# Patient Record
Sex: Female | Born: 1965 | Race: White | Hispanic: No | Marital: Married | Smoking: Never smoker
Health system: Southern US, Community
[De-identification: ages and names within clinical notes are randomized; demographics above are authoritative.]

## PROBLEM LIST (undated history)

## (undated) DIAGNOSIS — E119 Type 2 diabetes mellitus without complications: Secondary | ICD-10-CM

## (undated) DIAGNOSIS — I10 Essential (primary) hypertension: Secondary | ICD-10-CM

## (undated) DIAGNOSIS — R7303 Prediabetes: Secondary | ICD-10-CM

## (undated) DIAGNOSIS — K219 Gastro-esophageal reflux disease without esophagitis: Secondary | ICD-10-CM

## (undated) DIAGNOSIS — F419 Anxiety disorder, unspecified: Secondary | ICD-10-CM

## (undated) DIAGNOSIS — R011 Cardiac murmur, unspecified: Secondary | ICD-10-CM

## (undated) DIAGNOSIS — E785 Hyperlipidemia, unspecified: Secondary | ICD-10-CM

## (undated) HISTORY — PX: DILATION AND CURETTAGE OF UTERUS: SHX78

## (undated) HISTORY — DX: Prediabetes: R73.03

## (undated) HISTORY — PX: TENOTOMY: SHX397

## (undated) HISTORY — DX: Hyperlipidemia, unspecified: E78.5

## (undated) HISTORY — PX: COLONOSCOPY: SHX174

---

## 2004-10-02 ENCOUNTER — Emergency Department: Payer: Self-pay | Admitting: Emergency Medicine

## 2005-07-19 ENCOUNTER — Emergency Department: Payer: Self-pay | Admitting: Emergency Medicine

## 2008-05-31 ENCOUNTER — Emergency Department: Payer: Self-pay | Admitting: Emergency Medicine

## 2008-05-31 ENCOUNTER — Other Ambulatory Visit: Payer: Self-pay

## 2008-06-01 DIAGNOSIS — R011 Cardiac murmur, unspecified: Secondary | ICD-10-CM | POA: Insufficient documentation

## 2009-05-13 ENCOUNTER — Ambulatory Visit: Payer: Self-pay | Admitting: Gastroenterology

## 2013-10-17 ENCOUNTER — Ambulatory Visit: Payer: Self-pay

## 2013-10-17 DIAGNOSIS — M25519 Pain in unspecified shoulder: Secondary | ICD-10-CM

## 2013-10-17 DIAGNOSIS — R030 Elevated blood-pressure reading, without diagnosis of hypertension: Secondary | ICD-10-CM

## 2015-10-13 DIAGNOSIS — J309 Allergic rhinitis, unspecified: Secondary | ICD-10-CM | POA: Insufficient documentation

## 2015-12-26 ENCOUNTER — Encounter: Payer: Self-pay | Admitting: *Deleted

## 2015-12-27 NOTE — Discharge Instructions (Signed)

## 2015-12-28 ENCOUNTER — Encounter
Admission: RE | Disposition: A | Discharge status: Home / Self Care | Payer: Self-pay | Source: Ambulatory Visit | Attending: Gastroenterology

## 2015-12-28 ENCOUNTER — Encounter: Payer: Self-pay | Admitting: *Deleted

## 2015-12-28 ENCOUNTER — Ambulatory Visit: Payer: 59 | Admitting: Anesthesiology

## 2015-12-28 ENCOUNTER — Ambulatory Visit
Admission: RE | Admit: 2015-12-28 | Discharge: 2015-12-28 | Disposition: A | Discharge status: Home / Self Care | Payer: 59 | Source: Ambulatory Visit | Attending: Gastroenterology | Admitting: Gastroenterology

## 2015-12-28 DIAGNOSIS — R1310 Dysphagia, unspecified: Secondary | ICD-10-CM | POA: Diagnosis present

## 2015-12-28 DIAGNOSIS — I10 Essential (primary) hypertension: Secondary | ICD-10-CM | POA: Diagnosis not present

## 2015-12-28 DIAGNOSIS — Z888 Allergy status to other drugs, medicaments and biological substances status: Secondary | ICD-10-CM | POA: Diagnosis not present

## 2015-12-28 DIAGNOSIS — K210 Gastro-esophageal reflux disease with esophagitis: Secondary | ICD-10-CM | POA: Insufficient documentation

## 2015-12-28 DIAGNOSIS — Z79899 Other long term (current) drug therapy: Secondary | ICD-10-CM | POA: Diagnosis not present

## 2015-12-28 DIAGNOSIS — Z8371 Family history of colonic polyps: Secondary | ICD-10-CM | POA: Diagnosis not present

## 2015-12-28 HISTORY — DX: Anxiety disorder, unspecified: F41.9

## 2015-12-28 HISTORY — DX: Prediabetes: R73.03

## 2015-12-28 HISTORY — DX: Essential (primary) hypertension: I10

## 2015-12-28 HISTORY — DX: Cardiac murmur, unspecified: R01.1

## 2015-12-28 HISTORY — PX: ESOPHAGOGASTRODUODENOSCOPY: SHX5428

## 2015-12-28 HISTORY — DX: Gastro-esophageal reflux disease without esophagitis: K21.9

## 2015-12-28 SURGERY — EGD (ESOPHAGOGASTRODUODENOSCOPY)
Anesthesia: Monitor Anesthesia Care | Wound class: Clean Contaminated

## 2015-12-28 MED ORDER — LIDOCAINE HCL (CARDIAC) 20 MG/ML IV SOLN
INTRAVENOUS | Status: DC | PRN
  Administered 2015-12-28: 40 mg via INTRAVENOUS

## 2015-12-28 MED ORDER — PROPOFOL 10 MG/ML IV BOLUS
INTRAVENOUS | Status: DC | PRN
  Administered 2015-12-28: 30 mg via INTRAVENOUS
  Administered 2015-12-28: 20 mg via INTRAVENOUS
  Administered 2015-12-28: 30 mg via INTRAVENOUS
  Administered 2015-12-28: 20 mg via INTRAVENOUS
  Administered 2015-12-28: 70 mg via INTRAVENOUS

## 2015-12-28 MED ORDER — LACTATED RINGERS IV SOLN
INTRAVENOUS | Status: DC
  Administered 2015-12-28: 10:00:00 via INTRAVENOUS

## 2015-12-28 MED ORDER — GLYCOPYRROLATE 0.2 MG/ML IJ SOLN
INTRAMUSCULAR | Status: DC | PRN
  Administered 2015-12-28: 0.2 mg via INTRAVENOUS

## 2015-12-28 SURGICAL SUPPLY — 41 items
BALLN DILATOR 10-12 8 (BALLOONS)
BALLN DILATOR 12-15 8 (BALLOONS)
BALLN DILATOR 15-18 8 (BALLOONS)
BALLN DILATOR CRE 0-12 8 (BALLOONS)
BALLN DILATOR ESOPH 8 10 CRE (MISCELLANEOUS) IMPLANT
BALLOON DILATOR 12-15 8 (BALLOONS) IMPLANT
BALLOON DILATOR 15-18 8 (BALLOONS) IMPLANT
BALLOON DILATOR CRE 0-12 8 (BALLOONS) IMPLANT
BLOCK BITE 60FR ADLT L/F GRN (MISCELLANEOUS) ×3 IMPLANT
CANISTER SUCT 1200ML W/VALVE (MISCELLANEOUS) ×3 IMPLANT
FCP ESCP3.2XJMB 240X2.8X (MISCELLANEOUS)
FORCEPS BIOP RAD 4 LRG CAP 4 (CUTTING FORCEPS) ×3 IMPLANT
FORCEPS BIOP RJ4 240 W/NDL (MISCELLANEOUS)
FORCEPS ESCP3.2XJMB 240X2.8X (MISCELLANEOUS) IMPLANT
GOWN CVR UNV OPN BCK APRN NK (MISCELLANEOUS) ×1 IMPLANT
GOWN ISOL THUMB LOOP REG UNIV (MISCELLANEOUS) ×2
GOWN STRL REUS W/ TWL LRG LVL3 (GOWN DISPOSABLE) ×1 IMPLANT
GOWN STRL REUS W/TWL LRG LVL3 (GOWN DISPOSABLE) ×2
HEMOCLIP INSTINCT (CLIP) IMPLANT
INJECTOR VARIJECT VIN23 (MISCELLANEOUS) IMPLANT
KIT CO2 TUBING (TUBING) IMPLANT
KIT DEFENDO VALVE AND CONN (KITS) IMPLANT
KIT ENDO PROCEDURE OLY (KITS) ×3 IMPLANT
LIGATOR MULTIBAND 6SHOOTER MBL (MISCELLANEOUS) IMPLANT
MARKER SPOT ENDO TATTOO 5ML (MISCELLANEOUS) IMPLANT
PAD GROUND ADULT SPLIT (MISCELLANEOUS) IMPLANT
SNARE SHORT THROW 13M SML OVAL (MISCELLANEOUS) IMPLANT
SNARE SHORT THROW 30M LRG OVAL (MISCELLANEOUS) IMPLANT
SPOT EX ENDOSCOPIC TATTOO (MISCELLANEOUS)
SUCTION POLY TRAP 4CHAMBER (MISCELLANEOUS) IMPLANT
SYR INFLATION 60ML (SYRINGE) IMPLANT
TRAP SUCTION POLY (MISCELLANEOUS) IMPLANT
TUBING CONN 6MMX3.1M (TUBING)
TUBING SUCTION CONN 0.25 STRL (TUBING) IMPLANT
UNDERPAD 30X60 958B10 (PK) (MISCELLANEOUS) IMPLANT
VALVE BIOPSY ENDO (VALVE) IMPLANT
VARIJECT INJECTOR VIN23 (MISCELLANEOUS)
WATER AUXILLARY (MISCELLANEOUS) IMPLANT
WATER STERILE IRR 250ML POUR (IV SOLUTION) ×3 IMPLANT
WATER STERILE IRR 500ML POUR (IV SOLUTION) IMPLANT
WIRE CRE 18-20MM 8CM F G (MISCELLANEOUS) IMPLANT

## 2015-12-28 NOTE — Anesthesia Preprocedure Evaluation (Signed)
Anesthesia Evaluation  Patient identified by MRN, date of birth, ID band  Reviewed: Allergy & Precautions, H&P , NPO status , Patient's Chart, lab work & pertinent test results  Airway Mallampati: I  TM Distance: >3 FB Neck ROM: full    Dental no notable dental hx.    Pulmonary    Pulmonary exam normal        Cardiovascular hypertension,  Rhythm:regular Rate:Normal     Neuro/Psych    GI/Hepatic GERD  ,  Endo/Other    Renal/GU      Musculoskeletal   Abdominal   Peds  Hematology   Anesthesia Other Findings   Reproductive/Obstetrics                             Anesthesia Physical Anesthesia Plan  ASA: II  Anesthesia Plan: MAC   Post-op Pain Management:    Induction:   Airway Management Planned:   Additional Equipment:   Intra-op Plan:   Post-operative Plan:   Informed Consent: I have reviewed the patients History and Physical, chart, labs and discussed the procedure including the risks, benefits and alternatives for the proposed anesthesia with the patient or authorized representative who has indicated his/her understanding and acceptance.     Plan Discussed with: CRNA  Anesthesia Plan Comments:         Anesthesia Quick Evaluation  

## 2015-12-28 NOTE — Op Note (Signed)
[facility] Gastroenterology Patient Name: Alexandra CootsMichele Nolt Procedure Date: 12/28/2015 10:27 AM MRN: [id number] Account #: [id number] Date of Birth: 1966-01-13 Admit Type: Outpatient Age: 50 [facility] Gender: Female Note Status: Finalized Procedure:            Upper GI endoscopy Indications:          Odynophagia, Suspected esophageal reflux Providers:            Ezzard StandingPaul Y. Bluford Kaufmannh, MD Medicines:            Monitored Anesthesia Care Complications:        No immediate complications. Procedure:            Pre-Anesthesia Assessment:                       - Prior to the procedure, a History and Physical was                        performed, and patient medications, allergies and                        sensitivities were reviewed. The patient's tolerance of                        previous anesthesia was reviewed.                       - The risks and benefits of the procedure and the                        sedation options and risks were discussed with the                        patient. All questions were answered and informed                        consent was obtained.                       - After reviewing the risks and benefits, the patient                        was deemed in satisfactory condition to undergo the                        procedure.                       After obtaining informed consent, the endoscope was                        passed under direct vision. Throughout the procedure,                        the patient's blood pressure, pulse, and oxygen                        saturations were monitored continuously. The Olympus                        [id number] Endoscope (S#. [id number]) was introduced  through the mouth, and advanced to the second part of                        duodenum. The upper GI endoscopy was accomplished                        without difficulty. The patient tolerated the procedure   well. Findings:      Esophagitis was found at the gastroesophageal junction. Biopsies were       taken with a cold forceps for histology for possible Barrett's      The exam was otherwise without abnormality.      The entire examined stomach was normal.      The examined duodenum was normal. Impression:           - Reflux esophagitis. Rule out Barrett's esophagus.                        Biopsied.                       - The examination was otherwise normal.                       - Normal stomach.                       - Normal examined duodenum. Recommendation:       - Discharge patient to home.                       - Continue present medications.                       - Await pathology results.                       - The findings and recommendations were discussed with                        the patient's family. Procedure Code(s):    --- Professional ---                       [phone], Esophagogastroduodenoscopy, flexible, transoral;                        with biopsy, single or multiple Diagnosis Code(s):    --- Professional ---                       K21.0, Gastro-esophageal reflux disease with esophagitis                       R13.10, Dysphagia, unspecified CPT copyright 2016 American Medical Association. All rights reserved. The codes documented in this report are preliminary and upon coder review may  be revised to meet current compliance requirements. Wallace Cullens, MD 12/28/2015 10:47:28 AM This report has been signed electronically. Number of Addenda: 0 Note Initiated On: 12/28/2015 10:27 AM Total Procedure Duration: 0 hours 5 minutes 34 seconds       [facility]

## 2015-12-28 NOTE — Anesthesia Procedure Notes (Signed)
Procedure Name: MAC Performed by: Makaela Cando Pre-anesthesia Checklist: Patient identified, Emergency Drugs available, Suction available, Timeout performed and Patient being monitored Patient Re-evaluated:Patient Re-evaluated prior to inductionOxygen Delivery Method: Nasal cannula Placement Confirmation: positive ETCO2       

## 2015-12-28 NOTE — Anesthesia Postprocedure Evaluation (Signed)
Anesthesia Post Note  Patient: Alexandra Stone  Procedure(s) Performed: Procedure(s) (LRB): ESOPHAGOGASTRODUODENOSCOPY (EGD) (N/A)  Patient location during evaluation: PACU Anesthesia Type: MAC Level of consciousness: awake and alert and oriented Pain management: satisfactory to patient Vital Signs Assessment: post-procedure vital signs reviewed and stable Respiratory status: spontaneous breathing, nonlabored ventilation and respiratory function stable Cardiovascular status: blood pressure returned to baseline and stable Postop Assessment: Adequate PO intake and No signs of nausea or vomiting Anesthetic complications: no    Cherly BeachStella, Nyema Hachey J

## 2015-12-28 NOTE — Transfer of Care (Signed)
Immediate Anesthesia Transfer of Care Note  Patient: Alexandra Stone  Procedure(s) Performed: Procedure(s): ESOPHAGOGASTRODUODENOSCOPY (EGD) (N/A)  Patient Location: PACU  Anesthesia Type: MAC  Level of Consciousness: awake, alert  and patient cooperative  Airway and Oxygen Therapy: Patient Spontanous Breathing and Patient connected to supplemental oxygen  Post-op Assessment: Post-op Vital signs reviewed, Patient's Cardiovascular Status Stable, Respiratory Function Stable, Patent Airway and No signs of Nausea or vomiting  Post-op Vital Signs: Reviewed and stable  Complications: No apparent anesthesia complications

## 2015-12-28 NOTE — H&P (Signed)
  Date of Initial H&P: 12/23/2015  History reviewed, patient examined, no change in status, stable for surgery.

## 2015-12-29 ENCOUNTER — Encounter: Payer: Self-pay | Admitting: Gastroenterology

## 2015-12-30 LAB — SURGICAL PATHOLOGY

## 2016-02-25 ENCOUNTER — Encounter: Payer: Self-pay | Admitting: *Deleted

## 2016-02-25 ENCOUNTER — Ambulatory Visit
Admission: EM | Admit: 2016-02-25 | Discharge: 2016-02-25 | Disposition: A | Discharge status: Home / Self Care | Payer: 59 | Attending: Family Medicine | Admitting: Family Medicine

## 2016-02-25 DIAGNOSIS — J029 Acute pharyngitis, unspecified: Secondary | ICD-10-CM

## 2016-02-25 DIAGNOSIS — J069 Acute upper respiratory infection, unspecified: Secondary | ICD-10-CM

## 2016-02-25 LAB — RAPID STREP SCREEN (MED CTR MEBANE ONLY): Streptococcus, Group A Screen (Direct): NEGATIVE

## 2016-02-25 LAB — RAPID INFLUENZA A&B ANTIGENS: Influenza B (ARMC): NEGATIVE

## 2016-02-25 LAB — RAPID INFLUENZA A&B ANTIGENS (ARMC ONLY): INFLUENZA A (ARMC): NEGATIVE

## 2016-02-25 MED ORDER — BENZONATATE 200 MG PO CAPS: 200.0000 mg | ORAL_CAPSULE | Freq: Three times a day (TID) | ORAL | Status: DC | PRN

## 2016-02-25 MED ORDER — FLUTICASONE PROPIONATE 50 MCG/ACT NA SUSP: 2.0000 | Freq: Every day | NASAL | Status: DC

## 2016-02-25 MED ORDER — BENZONATATE 100 MG PO CAPS: 100.0000 mg | ORAL_CAPSULE | Freq: Three times a day (TID) | ORAL | Status: DC

## 2016-02-25 MED ORDER — FEXOFENADINE-PSEUDOEPHED ER 180-240 MG PO TB24: 1.0000 | ORAL_TABLET | Freq: Every day | ORAL | Status: DC

## 2016-02-25 NOTE — ED Provider Notes (Signed)
CSN: [id number]     Arrival date & time 02/25/16  1000 History   First MD Initiated Contact with Patient 02/25/16 1025    Nurses notes were reviewed.  Chief Complaint  Patient presents with  . Sore Throat  . Nasal Congestion  . Cough  . Headache    Patient with sore throat is congestion cough and headache. Everything started Wednesday. And she states the symptoms have continued and she is just not felt well. She had a coworker who had the flu earlier this week who was sick after she came back from a day being out still coughing and still congested. Patient does not smoke she has history of hypertension prediabetes GERD and anxiety. She's had a C-section colonoscopy and esophagogastroduodenoscopy.  She is allergic to lisinopril. No significant family medical history pertinent to today's visit.   (Consider location/radiation/quality/duration/timing/severity/associated sxs/prior Treatment) Patient is a 50 y.o. female presenting with pharyngitis, cough, and headaches. The history is provided by the patient. No language interpreter was used.  Sore Throat This is a new problem. The current episode started more than 2 days ago. The problem occurs constantly. The problem has not changed since onset.Associated symptoms include headaches. Nothing aggravates the symptoms.  Cough Associated symptoms: headaches   Headache Associated symptoms: cough     Past Medical History  Diagnosis Date  . Anxiety   . GERD (gastroesophageal reflux disease)   . Prediabetes   . Hypertension   . Heart murmur     followed by PCP   Past Surgical History  Procedure Laterality Date  . Cesarean section    . Dilation and curettage of uterus    . Colonoscopy    . Tenotomy Right     arm  . Esophagogastroduodenoscopy N/A 12/28/2015    Procedure: ESOPHAGOGASTRODUODENOSCOPY (EGD);  Surgeon: Wallace Cullens, MD;  [facility];  Service: Gastroenterology;  Laterality: N/A;   History reviewed. No pertinent  family history. Social History  Substance Use Topics  . Smoking status: Never Smoker   . Smokeless tobacco: None  . Alcohol Use: No   OB History    No data available     Review of Systems  Respiratory: Positive for cough.   Neurological: Positive for headaches.  All other systems reviewed and are negative.   Allergies  Lisinopril  Home Medications   Prior to Admission medications   Medication Sig Start Date End Date Taking? Authorizing Provider  CALCIUM PO Take by mouth daily.   Yes Historical Provider, MD  cetirizine (ZYRTEC) 10 MG tablet Take 10 mg by mouth daily.   Yes Historical Provider, MD  Cholecalciferol 1000 units capsule Take 1,000 Units by mouth daily.   Yes Historical Provider, MD  hydrochlorothiazide (HYDRODIURIL) 25 MG tablet Take 25 mg by mouth daily.   Yes Historical Provider, MD  mometasone (NASONEX) 50 MCG/ACT nasal spray Place 2 sprays into the nose daily as needed.   Yes Historical Provider, MD  Multiple Vitamins-Minerals (SUPER THERA VITE M PO) Take by mouth daily.   Yes Historical Provider, MD  pravastatin (PRAVACHOL) 40 MG tablet Take 40 mg by mouth daily.   Yes Historical Provider, MD  sertraline (ZOLOFT) 50 MG tablet Take 50 mg by mouth daily.   Yes Historical Provider, MD  benzonatate (TESSALON) 100 MG capsule Take 1 capsule (100 mg total) by mouth every 8 (eight) hours. 02/25/16   Hassan Rowan, MD  fexofenadine-pseudoephedrine (ALLEGRA-D ALLERGY & CONGESTION) 180-240 MG 24 hr tablet Take 1 tablet by  mouth daily. 02/25/16   Hassan RowanEugene Tait Balistreri, MD  omeprazole (PRILOSEC) 40 MG capsule Take 40 mg by mouth 2 (two) times daily.    Historical Provider, MD   Meds Ordered and Administered this Visit  Medications - No data to display  BP 135/80 mmHg  Pulse 71  Temp(Src) 98.7 F (37.1 C) (Oral)  Resp 16  Ht 5\' 3"  (1.6 m)  Wt 194 lb (87.998 kg)  BMI 34.37 kg/m2  SpO2 97%  LMP 11/28/2015 (Approximate) No data found.   Physical Exam  Constitutional: She is  oriented to person, place, and time. She appears well-developed and well-nourished.  HENT:  Head: Normocephalic and atraumatic.  Right Ear: Hearing, tympanic membrane, external ear and ear canal normal.  Left Ear: Hearing, tympanic membrane, external ear and ear canal normal.  Nose: Mucosal edema and rhinorrhea present.  Mouth/Throat: Uvula is midline and mucous membranes are normal. Posterior oropharyngeal erythema present.  Eyes: Conjunctivae are normal. Pupils are equal, round, and reactive to light.  Neck: Neck supple.  Cardiovascular: Normal rate, regular rhythm and normal heart sounds.   Pulmonary/Chest: Effort normal and breath sounds normal.  Musculoskeletal: Normal range of motion.  Lymphadenopathy:    She has cervical adenopathy.  Neurological: She is alert and oriented to person, place, and time.  Skin: Skin is warm and dry. No erythema.  Psychiatric: She has a normal mood and affect.  Vitals reviewed.   ED Course  Procedures (including critical care time)  Labs Review Labs Reviewed  RAPID INFLUENZA A&B ANTIGENS ([facility] ONLY)  RAPID STREP SCREEN (NOT AT [facility])  CULTURE, GROUP A STREP [facility])    Imaging Review No results found.   Visual Acuity Review  Right Eye Distance:   Left Eye Distance:   Bilateral Distance:    Right Eye Near:   Left Eye Near:    Bilateral Near:     Results for orders placed or performed during the hospital encounter of 02/25/16  Rapid Influenza A&B Antigens ([facility] only)  Result Value Ref Range   Influenza A ([facility]) NEGATIVE NEGATIVE   Influenza B ([facility]) NEGATIVE NEGATIVE  Rapid strep screen  Result Value Ref Range   Streptococcus, Group A Screen (Direct) NEGATIVE NEGATIVE     MDM   1. Acute URI   2. Acute pharyngitis, unspecified pharyngitis type    Strep test flu test negative will place on Tessalon Perles for cough 200 mg 2 times a day Allegra-D 1 tablet daily follow-up with us things are not better. She is on Zyrtec. That take  Allegra-D. We'll also send a prescription for Flonase Nasal spray 2 puffs each nostril daily as well.   Note: This dictation was prepared with Dragon dictation along with smaller phrase technology. Any transcriptional errors that result from this process are unintentional.  Hassan RowanEugene Armas Mcbee, MD 02/25/16 1807

## 2016-02-25 NOTE — Discharge Instructions (Signed)
Pharyngitis Pharyngitis is a sore throat (pharynx). There is redness, pain, and swelling of your throat. HOME CARE   Drink enough fluids to keep your pee (urine) clear or pale yellow.  Only take medicine as told by your doctor.  You may get sick again if you do not take medicine as told. Finish your medicines, even if you start to feel better.  Do not take aspirin.  Rest.  Rinse your mouth (gargle) with salt water ( tsp of salt per 1 qt of water) every 1-2 hours. This will help the pain.  If you are not at risk for choking, you can suck on hard candy or sore throat lozenges. GET HELP IF:  You have large, tender lumps on your neck.  You have a rash.  You cough up green, yellow-brown, or bloody spit. GET HELP RIGHT AWAY IF:   You have a stiff neck.  You drool or cannot swallow liquids.  You throw up (vomit) or are not able to keep medicine or liquids down.  You have very bad pain that does not go away with medicine.  You have problems breathing (not from a stuffy nose). MAKE SURE YOU:   Understand these instructions.  Will watch your condition.  Will get help right away if you are not doing well or get worse.   This information is not intended to replace advice given to you by your health care provider. Make sure you discuss any questions you have with your health care provider.   Document Released: 03/19/2008 Document Revised: 07/22/2013 Document Reviewed: 06/08/2013 Elsevier Interactive Patient Education 2016 Elsevier Inc.  Upper Respiratory Infection, Adult Most upper respiratory infections (URIs) are a viral infection of the air passages leading to the lungs. A URI affects the nose, throat, and upper air passages. The most common type of URI is nasopharyngitis and is typically referred to as "the common cold." URIs run their course and usually go away on their own. Most of the time, a URI does not require medical attention, but sometimes a bacterial infection in  the upper airways can follow a viral infection. This is called a secondary infection. Sinus and middle ear infections are common types of secondary upper respiratory infections. Bacterial pneumonia can also complicate a URI. A URI can worsen asthma and chronic obstructive pulmonary disease (COPD). Sometimes, these complications can require emergency medical care and may be life threatening.  CAUSES Almost all URIs are caused by viruses. A virus is a type of germ and can spread from one person to another.  RISKS FACTORS You may be at risk for a URI if:   You smoke.   You have chronic heart or lung disease.  You have a weakened defense (immune) system.   You are very young or very old.   You have nasal allergies or asthma.  You work in crowded or poorly ventilated areas.  You work in health care facilities or schools. SIGNS AND SYMPTOMS  Symptoms typically develop 2-3 days after you come in contact with a cold virus. Most viral URIs last 7-10 days. However, viral URIs from the influenza virus (flu virus) can last 14-18 days and are typically more severe. Symptoms may include:   Runny or stuffy (congested) nose.   Sneezing.   Cough.   Sore throat.   Headache.   Fatigue.   Fever.   Loss of appetite.   Pain in your forehead, behind your eyes, and over your cheekbones (sinus pain).  Muscle aches.  DIAGNOSIS  Your health care provider may diagnose a URI by:  Physical exam.  Tests to check that your symptoms are not due to another condition such as:  Strep throat.  Sinusitis.  Pneumonia.  Asthma. TREATMENT  A URI goes away on its own with time. It cannot be cured with medicines, but medicines may be prescribed or recommended to relieve symptoms. Medicines may help:  Reduce your fever.  Reduce your cough.  Relieve nasal congestion. HOME CARE INSTRUCTIONS   Take medicines only as directed by your health care provider.   Gargle warm saltwater or  take cough drops to comfort your throat as directed by your health care provider.  Use a warm mist humidifier or inhale steam from a shower to increase air moisture. This may make it easier to breathe.  Drink enough fluid to keep your urine clear or pale yellow.   Eat soups and other clear broths and maintain good nutrition.   Rest as needed.   Return to work when your temperature has returned to normal or as your health care provider advises. You may need to stay home longer to avoid infecting others. You can also use a face mask and careful hand washing to prevent spread of the virus.  Increase the usage of your inhaler if you have asthma.   Do not use any tobacco products, including cigarettes, chewing tobacco, or electronic cigarettes. If you need help quitting, ask your health care provider. PREVENTION  The best way to protect yourself from getting a cold is to practice good hygiene.   Avoid oral or hand contact with people with cold symptoms.   Wash your hands often if contact occurs.  There is no clear evidence that vitamin C, vitamin E, echinacea, or exercise reduces the chance of developing a cold. However, it is always recommended to get plenty of rest, exercise, and practice good nutrition.  SEEK MEDICAL CARE IF:   You are getting worse rather than better.   Your symptoms are not controlled by medicine.   You have chills.  You have worsening shortness of breath.  You have brown or red mucus.  You have yellow or brown nasal discharge.  You have pain in your face, especially when you bend forward.  You have a fever.  You have swollen neck glands.  You have pain while swallowing.  You have white areas in the back of your throat. SEEK IMMEDIATE MEDICAL CARE IF:   You have severe or persistent:  Headache.  Ear pain.  Sinus pain.  Chest pain.  You have chronic lung disease and any of the following:  Wheezing.  Prolonged cough.  Coughing up  blood.  A change in your usual mucus.  You have a stiff neck.  You have changes in your:  Vision.  Hearing.  Thinking.  Mood. MAKE SURE YOU:   Understand these instructions.  Will watch your condition.  Will get help right away if you are not doing well or get worse.   This information is not intended to replace advice given to you by your health care provider. Make sure you discuss any questions you have with your health care provider.   Document Released: 03/27/2001 Document Revised: 02/15/2015 Document Reviewed: 01/06/2014 Elsevier Interactive Patient Education [employer] Elsevier Inc.

## 2016-02-25 NOTE — ED Notes (Signed)
Productive cough- yellow, sore throat, runny nose and head congestion, headache, onset Wednesday. Denies fever.

## 2016-02-27 LAB — CULTURE, GROUP A STREP (THRC)

## 2016-05-15 HISTORY — PX: CERVICAL DISC SURGERY: SHX588

## 2017-02-05 ENCOUNTER — Encounter: Payer: Self-pay | Admitting: Obstetrics and Gynecology

## 2017-02-05 ENCOUNTER — Ambulatory Visit (INDEPENDENT_AMBULATORY_CARE_PROVIDER_SITE_OTHER): Payer: 59 | Admitting: Obstetrics and Gynecology

## 2017-02-05 VITALS — BP 124/84 | HR 69 | Ht 63.0 in | Wt 192.0 lb

## 2017-02-05 DIAGNOSIS — N951 Menopausal and female climacteric states: Secondary | ICD-10-CM

## 2017-02-05 DIAGNOSIS — Z1231 Encounter for screening mammogram for malignant neoplasm of breast: Secondary | ICD-10-CM

## 2017-02-05 DIAGNOSIS — Z01419 Encounter for gynecological examination (general) (routine) without abnormal findings: Secondary | ICD-10-CM

## 2017-02-05 DIAGNOSIS — Z1239 Encounter for other screening for malignant neoplasm of breast: Secondary | ICD-10-CM

## 2017-02-05 NOTE — Progress Notes (Signed)
Chief Complaint  Patient presents with  . Gynecologic Exam    HPI:      [name] is a 51 y.o. No obstetric history on file. who LMP was Patient's last menstrual period was 12/08/2016 (exact date)., presents today for her annual examination.  Her menses are irregular, monthly to every few months, lasting 5 days.  Dysmenorrhea none. She does not have intermenstrual bleeding.  She does have vasomotor sx.  Sex activity: single partner, contraception - vasectomy. She does have vaginal dryness.  Last Pap: January 10, 2015  Results were: no abnormalities /neg HPV DNA.  Hx of STDs: none  Last mammogram: January 11, 2016  Results were: normal--routine follow-up in 12 months There is no FH of breast cancer. There is no FH of ovarian cancer. The patient does do self-breast exams.  Colonoscopy: colonoscopy 8 years ago without abnormalities.    Tobacco use: The patient denies current or previous tobacco use. Alcohol use: none Exercise: moderately active  She does get adequate calcium and Vitamin D in her diet.   Past Medical History:  Diagnosis Date  . Anxiety   . GERD (gastroesophageal reflux disease)   . GERD (gastroesophageal reflux disease)   . Heart murmur    followed by PCP  . Hyperlipidemia   . Hypertension   . Pre-diabetes   . Prediabetes     Past Surgical History:  Procedure Laterality Date  . CERVICAL DISC SURGERY  05/2016   anterior fusion  . CESAREAN SECTION    . COLONOSCOPY    . DILATION AND CURETTAGE OF UTERUS    . ESOPHAGOGASTRODUODENOSCOPY N/A 12/28/2015   Procedure: ESOPHAGOGASTRODUODENOSCOPY (EGD);  Surgeon: Wallace Cullens, MD;  Location: [facility];  Service: Gastroenterology;  Laterality: N/A;  . TENOTOMY Right    arm    History reviewed. No pertinent family history.  Social History   Social History  . Marital status: Married    Spouse name: N/A  . Number of children: N/A  . Years of education: N/A   Occupational History  . Not on  file.   Social History Main Topics  . Smoking status: Never Smoker  . Smokeless tobacco: Never Used  . Alcohol use No  . Drug use: No  . Sexual activity: Yes    Birth control/ protection: None   Other Topics Concern  . Not on file   Social History Narrative  . No narrative on file     Current Outpatient Prescriptions:  .  Cholecalciferol (VITAMIN D3) 1000 units CAPS, 2 (two) times daily., Disp: , Rfl:  .  hydrochlorothiazide (HYDRODIURIL) 25 MG tablet, 25 mg., Disp: , Rfl:  .  losartan (COZAAR) 25 MG tablet, Take 25 mg by mouth daily., Disp: , Rfl:  .  metFORMIN (GLUCOPHAGE) 500 MG tablet, Take by mouth 2 (two) times daily with a meal., Disp: , Rfl:  .  mometasone (NASONEX) 50 MCG/ACT nasal spray, NASONEX 50 MCG/ACT SUSP, Disp: , Rfl:  .  Multiple Vitamins-Minerals (SUPER THERA VITE M PO), Take by mouth daily., Disp: , Rfl:  .  omeprazole (PRILOSEC) 40 MG capsule, Take 40 mg by mouth 2 (two) times daily., Disp: , Rfl:  .  pravastatin (PRAVACHOL) 40 MG tablet, Take 40 mg by mouth daily., Disp: , Rfl:    ROS:  Review of Systems  Constitutional: Negative for fever, malaise/fatigue and weight loss.  HENT: Negative for congestion, ear pain and sinus pain.   Respiratory: Negative for cough, shortness of  breath and wheezing.   Cardiovascular: Negative for chest pain, orthopnea and leg swelling.  Gastrointestinal: Negative for constipation, diarrhea, nausea and vomiting.  Genitourinary: Negative for dysuria, frequency, hematuria and urgency.       Breast ROS: negative   Musculoskeletal: Negative for back pain, joint pain and myalgias.  Skin: Negative for itching and rash.  Neurological: Negative for dizziness, tingling, focal weakness and headaches.  Endo/Heme/Allergies: Negative for environmental allergies. Does not bruise/bleed easily.  Psychiatric/Behavioral: Negative for depression and suicidal ideas. The patient is not nervous/anxious and does not have insomnia.      Objective: BP 124/84   Pulse 69   Ht  (1.6 m)   Wt 192 lb (87.1 kg)   LMP 12/08/2016 (Exact Date)   BMI 34.01 kg/m    Physical Exam  Constitutional: She is oriented to person, place, and time. She appears well-developed and well-nourished.  Genitourinary: Vagina normal and uterus normal. No erythema or tenderness in the vagina. No vaginal discharge found. Right adnexum does not display mass and does not display tenderness. Left adnexum does not display mass and does not display tenderness. Cervix does not exhibit motion tenderness or polyp. Uterus is not enlarged or tender.  Neck: Normal range of motion. No thyromegaly present.  Cardiovascular: Normal rate, regular rhythm and normal heart sounds.   No murmur heard. Pulmonary/Chest: Effort normal and breath sounds normal. Right breast exhibits no mass, no nipple discharge, no skin change and no tenderness. Left breast exhibits no mass, no nipple discharge, no skin change and no tenderness.  Abdominal: Soft. There is no tenderness. There is no guarding.  Musculoskeletal: Normal range of motion.  Neurological: She is alert and oriented to person, place, and time. No cranial nerve deficit.  Psychiatric: She has a normal mood and affect. Her behavior is normal.  Vitals reviewed.   Assessment/Plan:  Encounter for annual routine gynecological examination  Screening for breast cancer - Pt to sched mammo. - Plan: MM DIGITAL SCREENING BILATERAL  Perimenopause - F/u prn DUB sx.        GYN counsel mammography screening, menopause, adequate intake of calcium and vitamin D, diet and exercise     F/U  Return in about 1 year (around 02/05/2018).  Antwion Carpenter B. Giliana Vantil, PA-C 02/05/2017 11:32 AM

## 2017-02-15 ENCOUNTER — Encounter: Payer: Self-pay | Admitting: Obstetrics and Gynecology

## 2017-02-19 ENCOUNTER — Encounter: Payer: Self-pay | Admitting: Obstetrics and Gynecology

## 2018-01-15 ENCOUNTER — Other Ambulatory Visit: Payer: Self-pay | Admitting: Gastroenterology

## 2018-01-15 DIAGNOSIS — R1310 Dysphagia, unspecified: Secondary | ICD-10-CM

## 2018-01-15 DIAGNOSIS — K219 Gastro-esophageal reflux disease without esophagitis: Secondary | ICD-10-CM

## 2018-01-20 ENCOUNTER — Ambulatory Visit
Admission: RE | Admit: 2018-01-20 | Discharge: 2018-01-20 | Disposition: A | Discharge status: Home / Self Care | Payer: Managed Care, Other (non HMO) | Source: Ambulatory Visit | Attending: Gastroenterology | Admitting: Gastroenterology

## 2018-01-20 DIAGNOSIS — K449 Diaphragmatic hernia without obstruction or gangrene: Secondary | ICD-10-CM | POA: Diagnosis not present

## 2018-01-20 DIAGNOSIS — K219 Gastro-esophageal reflux disease without esophagitis: Secondary | ICD-10-CM

## 2018-01-20 DIAGNOSIS — R1310 Dysphagia, unspecified: Secondary | ICD-10-CM | POA: Insufficient documentation

## 2018-02-18 ENCOUNTER — Encounter: Payer: Self-pay | Admitting: Obstetrics and Gynecology

## 2018-02-18 ENCOUNTER — Ambulatory Visit (INDEPENDENT_AMBULATORY_CARE_PROVIDER_SITE_OTHER): Payer: Managed Care, Other (non HMO) | Admitting: Obstetrics and Gynecology

## 2018-02-18 VITALS — BP 142/92 | HR 90 | Ht 63.0 in | Wt 193.0 lb

## 2018-02-18 DIAGNOSIS — Z1151 Encounter for screening for human papillomavirus (HPV): Secondary | ICD-10-CM | POA: Diagnosis not present

## 2018-02-18 DIAGNOSIS — N951 Menopausal and female climacteric states: Secondary | ICD-10-CM

## 2018-02-18 DIAGNOSIS — Z1231 Encounter for screening mammogram for malignant neoplasm of breast: Secondary | ICD-10-CM | POA: Diagnosis not present

## 2018-02-18 DIAGNOSIS — Z1239 Encounter for other screening for malignant neoplasm of breast: Secondary | ICD-10-CM

## 2018-02-18 DIAGNOSIS — Z124 Encounter for screening for malignant neoplasm of cervix: Secondary | ICD-10-CM

## 2018-02-18 DIAGNOSIS — Z780 Asymptomatic menopausal state: Secondary | ICD-10-CM | POA: Insufficient documentation

## 2018-02-18 DIAGNOSIS — Z01419 Encounter for gynecological examination (general) (routine) without abnormal findings: Secondary | ICD-10-CM | POA: Diagnosis not present

## 2018-02-18 NOTE — Progress Notes (Signed)
Chief Complaint  Patient presents with  . Gynecologic Exam     HPI:      [name] is a 52 y.o. [id number] who LMP was Patient's last menstrual period was 01/26/2018 (exact date)., presents today for her annual examination.  Her menses are irregular,  every few months due to perimenopause, lasting 5 days.  Dysmenorrhea none. She does not have intermenstrual bleeding.  She does have tolerable vasomotor sx.  Sex activity: single partner, contraception - vasectomy. She does have vaginal dryness and uses lubricants with relief.  Last Pap: January 10, 2015  Results were: no abnormalities /neg HPV DNA.  Hx of STDs: none  Last mammogram: 02/15/17  Results were: normal--routine follow-up in 12 months There is no FH of breast cancer. There is no FH of ovarian cancer. The patient does do self-breast exams.  Colonoscopy: colonoscopy 9 years ago without abnormalities. Repeat due in 10 yrs.  Tobacco use: The patient denies current or previous tobacco use. Alcohol use: none Exercise: moderately active  She does get adequate calcium and Vitamin D in her diet.  Labs with PCP.    Past Medical History:  Diagnosis Date  . Anxiety   . GERD (gastroesophageal reflux disease)   . GERD (gastroesophageal reflux disease)   . Heart murmur    followed by PCP  . Hyperlipidemia   . Hypertension   . Pre-diabetes   . Prediabetes     Past Surgical History:  Procedure Laterality Date  . CERVICAL DISC SURGERY  05/2016   anterior fusion  . CESAREAN SECTION    . COLONOSCOPY    . DILATION AND CURETTAGE OF UTERUS    . ESOPHAGOGASTRODUODENOSCOPY N/A 12/28/2015   Procedure: ESOPHAGOGASTRODUODENOSCOPY (EGD);  Surgeon: Wallace Cullens, MD;  Location: [facility];  Service: Gastroenterology;  Laterality: N/A;  . TENOTOMY Right    arm    History reviewed. No pertinent family history.  Social History   Socioeconomic History  . Marital status: Married    Spouse name: Not on file  .  Number of children: Not on file  . Years of education: Not on file  . Highest education level: Not on file  Occupational History  . Not on file  Social Needs  . Financial resource strain: Not on file  . Food insecurity:    Worry: Not on file    Inability: Not on file  . Transportation needs:    Medical: Not on file    Non-medical: Not on file  Tobacco Use  . Smoking status: Never Smoker  . Smokeless tobacco: Never Used  Substance and Sexual Activity  . Alcohol use: No  . Drug use: No  . Sexual activity: Yes    Birth control/protection: None  Lifestyle  . Physical activity:    Days per week: Not on file    Minutes per session: Not on file  . Stress: Not on file  Relationships  . Social connections:    Talks on phone: Not on file    Gets together: Not on file    Attends religious service: Not on file    Active member of club or organization: Not on file    Attends meetings of clubs or organizations: Not on file    Relationship status: Not on file  . Intimate partner violence:    Fear of current or ex partner: Not on file    Emotionally abused: Not on file    Physically abused: Not on file  Forced sexual activity: Not on file  Other Topics Concern  . Not on file  Social History Narrative  . Not on file    Current Outpatient Medications on File Prior to Visit  Medication Sig Dispense Refill  . cetirizine (ZYRTEC) 10 MG tablet Take by mouth.    . Cholecalciferol (VITAMIN D3) 1000 units CAPS 2 (two) times daily.    Marland Kitchen HYDRALAZINE-RESERPINE-HCTZ PO Hctz/Reserpine/Hydralazine    . hydrochlorothiazide (HYDRODIURIL) 25 MG tablet 25 mg.    . losartan (COZAAR) 25 MG tablet Take 25 mg by mouth daily.    . metFORMIN (GLUCOPHAGE) 500 MG tablet Take by mouth 2 (two) times daily with a meal.    . mometasone (NASONEX) 50 MCG/ACT nasal spray NASONEX 50 MCG/ACT SUSP    . Multiple Vitamins-Minerals (SUPER THERA VITE M PO) Take by mouth daily.    Marland Kitchen omeprazole (PRILOSEC) 40 MG  capsule Take 40 mg by mouth 2 (two) times daily.    . pravastatin (PRAVACHOL) 40 MG tablet Take 40 mg by mouth daily.    . sucralfate (CARAFATE) 1 g tablet Take by mouth.    Marland Kitchen omeprazole (PRILOSEC) 40 MG capsule omeprazole 20 mg tablet,delayed release  Take by oral route.     No current facility-administered medications on file prior to visit.     ROS:  Review of Systems  Constitutional: Negative for fatigue, fever and unexpected weight change.  Respiratory: Negative for cough, shortness of breath and wheezing.   Cardiovascular: Negative for chest pain, palpitations and leg swelling.  Gastrointestinal: Negative for blood in stool, constipation, diarrhea, nausea and vomiting.  Endocrine: Negative for cold intolerance, heat intolerance and polyuria.  Genitourinary: Negative for dyspareunia, dysuria, flank pain, frequency, genital sores, hematuria, menstrual problem, pelvic pain, urgency, vaginal bleeding, vaginal discharge and vaginal pain.  Musculoskeletal: Negative for back pain, joint swelling and myalgias.  Skin: Negative for rash.  Neurological: Negative for dizziness, syncope, light-headedness, numbness and headaches.  Hematological: Negative for adenopathy.  Psychiatric/Behavioral: Negative for agitation, confusion, sleep disturbance and suicidal ideas. The patient is not nervous/anxious.      Objective: BP (!) 142/92   Pulse 90   Ht  (1.6 m)   Wt 193 lb (87.5 kg)   LMP 01/26/2018 (Exact Date)   BMI 34.19 kg/m    Physical Exam  Constitutional: She is oriented to person, place, and time. She appears well-developed and well-nourished.  Genitourinary: Vagina normal and uterus normal. There is no rash or tenderness on the right labia. There is no rash or tenderness on the left labia. No erythema or tenderness in the vagina. No vaginal discharge found. Right adnexum does not display mass and does not display tenderness. Left adnexum does not display mass and does not  display tenderness. Cervix does not exhibit motion tenderness or polyp. Uterus is not enlarged or tender.  Neck: Normal range of motion. No thyromegaly present.  Cardiovascular: Normal rate, regular rhythm and normal heart sounds.  No murmur heard. Pulmonary/Chest: Effort normal and breath sounds normal. Right breast exhibits no mass, no nipple discharge, no skin change and no tenderness. Left breast exhibits no mass, no nipple discharge, no skin change and no tenderness.  Abdominal: Soft. There is no tenderness. There is no guarding.  Musculoskeletal: Normal range of motion.  Neurological: She is alert and oriented to person, place, and time. No cranial nerve deficit.  Psychiatric: She has a normal mood and affect. Her behavior is normal.  Vitals reviewed.   Assessment/Plan: Encounter for  annual routine gynecological examination  Cervical cancer screening - Plan: IGP, Aptima HPV  Screening for HPV (human papillomavirus) - Plan: IGP, Aptima HPV  Screening for breast cancer - Pt to sched mammo at [facility] - Plan: MM DIGITAL SCREENING BILATERAL  Perimenopause - F/u prn DUB      GYN counsel breast self exam, mammography screening, menopause, adequate intake of calcium and vitamin D, diet and exercise     F/U  Return in about 1 year (around 02/19/2019).  Alicia B. Copland, PA-C 02/18/2018 11:40 AM

## 2018-02-18 NOTE — Patient Instructions (Addendum)
I value your feedback and entrusting us with your care. If you get a [facility] patient survey, I would appreciate you taking the time to let us know about your experience today. Thank you!    [facility]: [phone]  

## 2018-02-20 LAB — IGP, APTIMA HPV
HPV APTIMA: NEGATIVE
PAP SMEAR COMMENT: 0

## 2018-03-03 ENCOUNTER — Encounter: Payer: Self-pay | Admitting: Obstetrics and Gynecology

## 2018-04-19 ENCOUNTER — Encounter: Payer: Self-pay | Admitting: Emergency Medicine

## 2018-04-19 DIAGNOSIS — Z7984 Long term (current) use of oral hypoglycemic drugs: Secondary | ICD-10-CM | POA: Insufficient documentation

## 2018-04-19 DIAGNOSIS — W260XXA Contact with knife, initial encounter: Secondary | ICD-10-CM | POA: Diagnosis not present

## 2018-04-19 DIAGNOSIS — Z23 Encounter for immunization: Secondary | ICD-10-CM | POA: Insufficient documentation

## 2018-04-19 DIAGNOSIS — Y9389 Activity, other specified: Secondary | ICD-10-CM | POA: Diagnosis not present

## 2018-04-19 DIAGNOSIS — I10 Essential (primary) hypertension: Secondary | ICD-10-CM | POA: Diagnosis not present

## 2018-04-19 DIAGNOSIS — S61211A Laceration without foreign body of left index finger without damage to nail, initial encounter: Secondary | ICD-10-CM | POA: Diagnosis present

## 2018-04-19 DIAGNOSIS — Z79899 Other long term (current) drug therapy: Secondary | ICD-10-CM | POA: Insufficient documentation

## 2018-04-19 DIAGNOSIS — Y999 Unspecified external cause status: Secondary | ICD-10-CM | POA: Insufficient documentation

## 2018-04-19 DIAGNOSIS — Y929 Unspecified place or not applicable: Secondary | ICD-10-CM | POA: Insufficient documentation

## 2018-04-19 NOTE — ED Triage Notes (Signed)
Patient states that she cut the tip of her left second finger tonight. Bleeding controlled at this time.

## 2018-04-20 ENCOUNTER — Emergency Department
Admission: EM | Admit: 2018-04-20 | Discharge: 2018-04-20 | Disposition: A | Discharge status: Home / Self Care | Payer: Managed Care, Other (non HMO) | Attending: Emergency Medicine | Admitting: Emergency Medicine

## 2018-04-20 DIAGNOSIS — S61211A Laceration without foreign body of left index finger without damage to nail, initial encounter: Secondary | ICD-10-CM

## 2018-04-20 MED ORDER — TETANUS-DIPHTH-ACELL PERTUSSIS 5-2.5-18.5 LF-MCG/0.5 IM SUSP
0.5000 mL | Freq: Once | INTRAMUSCULAR | Status: AC
  Administered 2018-04-20: 0.5 mL via INTRAMUSCULAR
  Filled 2018-04-20: qty 0.5

## 2018-04-20 MED ORDER — BUPIVACAINE HCL (PF) 0.5 % IJ SOLN
INTRAMUSCULAR | Status: AC
  Administered 2018-04-20: 30 mL
  Filled 2018-04-20: qty 30

## 2018-04-20 MED ORDER — BUPIVACAINE HCL (PF) 0.5 % IJ SOLN
30.0000 mL | Freq: Once | INTRAMUSCULAR | Status: AC
  Administered 2018-04-20: 30 mL
  Filled 2018-04-20: qty 30

## 2018-04-20 NOTE — Discharge Instructions (Signed)
Today I placed a total of four 6-0 nylon sutures in your fingertip that need to come out in about 10 days.  Any doctor can do this.  Return to the emergency department for any concerns.  It was a pleasure to take care of you today, and thank you for coming to our emergency department.  If you have any questions or concerns before leaving please ask the nurse to grab me and I'm more than happy to go through your aftercare instructions again.  If you were prescribed any opioid pain medication today such as Norco, Vicodin, Percocet, morphine, hydrocodone, or oxycodone please make sure you do not drive when you are taking this medication as it can alter your ability to drive safely.  If you have any concerns once you are home that you are not improving or are in fact getting worse before you can make it to your follow-up appointment, please do not hesitate to call 911 and come back for further evaluation.  Merrily BrittleNeil Quianna Avery, MD

## 2018-04-20 NOTE — ED Notes (Signed)
Patient sitting in lobby in no acute distress at this time.  

## 2018-04-20 NOTE — ED Provider Notes (Signed)
[facility] Emergency Department Provider Note  ____________________________________________   First MD Initiated Contact with Patient 04/20/18 0041     (approximate)  I have reviewed the triage vital signs and the nursing notes.   HISTORY  Chief Complaint Laceration   HPI Alexandra Stone is a 52 y.o. female who comes to the emergency department with a laceration to her left index finger.  She is right-hand dominant and was cutting a steak with for dinner when the knife slipped and cut the distal tip of her finger.  She washed the wound out with tap water and hydrogen peroxide and then put Neosporin on it and went to bed.  Once in bed she noted that she began to bleed so she came to the emergency department.  She had a sudden onset moderate severity sharp stinging pain nonradiating in the tip of her finger.  Worse with movement improved with rest.  Her last tetanus is unknown.    Past Medical History:  Diagnosis Date  . Anxiety   . GERD (gastroesophageal reflux disease)   . GERD (gastroesophageal reflux disease)   . Heart murmur    followed by PCP  . Hyperlipidemia   . Hypertension   . Pre-diabetes   . Prediabetes     Patient Active Problem List   Diagnosis Date Noted  . Perimenopause 02/18/2018    Past Surgical History:  Procedure Laterality Date  . CERVICAL DISC SURGERY  05/2016   anterior fusion  . CESAREAN SECTION    . COLONOSCOPY    . DILATION AND CURETTAGE OF UTERUS    . ESOPHAGOGASTRODUODENOSCOPY N/A 12/28/2015   Procedure: ESOPHAGOGASTRODUODENOSCOPY (EGD);  Surgeon: Wallace Cullens, MD;  Location: [facility];  Service: Gastroenterology;  Laterality: N/A;  . TENOTOMY Right    arm    Prior to Admission medications   Medication Sig Start Date End Date Taking? Authorizing Provider  cetirizine (ZYRTEC) 10 MG tablet Take by mouth. 11/08/15   [provider]  Cholecalciferol (VITAMIN D3) 1000 units CAPS 2 (two) times daily.  01/27/13   [provider]  HYDRALAZINE-RESERPINE-HCTZ PO Hctz/Reserpine/Hydralazine    [provider]  hydrochlorothiazide (HYDRODIURIL) 25 MG tablet 25 mg. 09/15/10   [provider]  losartan (COZAAR) 25 MG tablet Take 25 mg by mouth daily.    [provider]  metFORMIN (GLUCOPHAGE) 500 MG tablet Take by mouth 2 (two) times daily with a meal.    [provider]  mometasone (NASONEX) 50 MCG/ACT nasal spray NASONEX 50 MCG/ACT SUSP 11/08/15   [provider]  Multiple Vitamins-Minerals (SUPER THERA VITE M PO) Take by mouth daily.    [provider]  omeprazole (PRILOSEC) 40 MG capsule Take 40 mg by mouth 2 (two) times daily.    [provider]  omeprazole (PRILOSEC) 40 MG capsule omeprazole 20 mg tablet,delayed release  Take by oral route.    [provider]  pravastatin (PRAVACHOL) 40 MG tablet Take 40 mg by mouth daily.    [provider]  sucralfate (CARAFATE) 1 g tablet Take by mouth. 02/13/18   [provider]    Allergies Lisinopril and Sulfa antibiotics  No family history on file.  Social History Social History   Tobacco Use  . Smoking status: Never Smoker  . Smokeless tobacco: Never Used  Substance Use Topics  . Alcohol use: No  . Drug use: No    Review of Systems Constitutional: No fever/chills Cardiovascular: Denies chest pain. Respiratory:  Denies shortness of breath. Gastrointestinal: No abdominal pain.  No nausea, no vomiting.   Musculoskeletal: Positive for finger pain Neurological: Negative for headaches   ____________________________________________   PHYSICAL EXAM:  VITAL SIGNS: ED Triage Vitals  Enc Vitals Group     BP 04/19/18 2305 (!) 164/93     Pulse Rate 04/19/18 2305 78     Resp 04/19/18 2305 18     Temp 04/19/18 2305 98.4 F (36.9 C)     Temp Source 04/19/18 2305 Oral     SpO2 04/19/18 2305 96 %     Weight 04/19/18 2306 190 lb (86.2 kg)      Height 04/19/18 2306 5\' 3"  (1.6 m)     Head Circumference --      Peak Flow --      Pain Score 04/19/18 2306 3     Pain Loc --      Pain Edu? --      Excl. in GC? --     Constitutional: Alert and oriented x4 pleasant cooperative speaks of a clear sentences no diaphoresis Head: Atraumatic. Nose: No congestion/rhinnorhea. Mouth/Throat: No trismus Neck: No stridor.   Cardiovascular: Regular rate and rhythm Respiratory: Normal respiratory effort.  No retractions. Gastrointestinal: Laceration to distal aspect of left index finger as below.  Does not involve the nail.  Neurovascularly intact Neurologic:  Normal speech and language. No gross focal neurologic deficits are appreciated.  Skin: 3 cm laceration to the volar aspect of the distal index finger    ____________________________________________  LABS (all labs ordered are listed, but only abnormal results are displayed)  Labs Reviewed - No data to display   __________________________________________  EKG   ____________________________________________  RADIOLOGY   ____________________________________________   DIFFERENTIAL includes but not limited to  Finger laceration, retained foreign body, fracture   PROCEDURES  Procedure(s) performed: Yes  .Nerve Block Date/Time: 04/20/2018 12:50 AM Performed by: Merrily Brittle, MD Authorized by: Merrily Brittle, MD   Consent:    Consent obtained:  Verbal   Consent given by:  Patient   Risks discussed:  Nerve damage, unsuccessful block, pain, allergic reaction and bleeding   Alternatives discussed:  Alternative treatment and no treatment Indications:    Indications:  Procedural anesthesia and pain relief Location:    Body area:  Upper extremity   Upper extremity nerve blocked: Left index finger.   Laterality:  Left Pre-procedure details:    Skin preparation:  Alcohol   Preparation: Patient was prepped and draped in usual sterile fashion   Skin anesthesia (see MAR  for exact dosages):    Skin anesthesia method:  None Procedure details (see MAR for exact dosages):    Block needle gauge:  25 G   Anesthetic injected:  Bupivacaine 0.5% w/o epi   Steroid injected:  None   Additive injected:  None   Injection procedure:  Anatomic landmarks identified, anatomic landmarks palpated, incremental injection and negative aspiration for blood   Paresthesia:  None Post-procedure details:    Dressing:  None   Outcome:  Pain improved .Marland KitchenLaceration Repair Date/Time: 04/20/2018 12:51 AM Performed by: Merrily Brittle, MD Authorized by: Merrily Brittle, MD   Consent:    Consent obtained:  Verbal   Consent given by:  Patient   Risks discussed:  Pain, poor cosmetic result, poor wound healing and infection Anesthesia (see MAR for exact dosages):    Anesthesia method:  Nerve block   Block needle gauge:  25 G   Block anesthetic:  Bupivacaine 0.5% w/o  epi   Block technique:  Digital block   Block injection procedure:  Anatomic landmarks identified and negative aspiration for blood   Block outcome:  Incomplete block Laceration details:    Location:  Finger   Finger location:  L index finger   Length (cm):  3 Repair type:    Repair type:  Simple Pre-procedure details:    Preparation:  Patient was prepped and draped in usual sterile fashion Exploration:    Hemostasis achieved with:  Tourniquet and direct pressure   Wound exploration: wound explored through full range of motion and entire depth of wound probed and visualized     Wound extent: areolar tissue violated     Wound extent: no fascia violation noted, no foreign bodies/material noted, no muscle damage noted, no nerve damage noted, no tendon damage noted, no underlying fracture noted and no vascular damage noted   Treatment:    Area cleansed with:  Soap and water   Amount of cleaning:  Extensive   Irrigation solution:  Tap water   Irrigation method:  Tap   Visualized foreign bodies/material removed: no     Skin repair:    Repair method:  Sutures   Suture size:  6-0   Suture material:  Nylon   Number of sutures:  4 Approximation:    Approximation:  Close Post-procedure details:    Dressing:  Adhesive bandage   Patient tolerance of procedure:  Tolerated well, no immediate complications    Critical Care performed: no  ____________________________________________   INITIAL IMPRESSION / ASSESSMENT AND PLAN / ED COURSE  Pertinent labs & imaging results that were available during my care of the patient were reviewed by me and considered in my medical decision making (see chart for details).   The patient has a 3 cm laceration to the volar aspect of her distal index finger.  I obtained verbal consent for digital block and use 0.5% bupivacaine without epinephrine which did not completely anesthetize her finger but improved enough to perform the procedure.  The wound was washed out with copious amounts of tap water.  I then applied a tourniquet and bled out the wound and was able to place for 6-0 sutures with good cosmesis.  10 days suture removal in 2-day wound check.  She is discharged home in improved condition.      ____________________________________________   FINAL CLINICAL IMPRESSION(S) / ED DIAGNOSES  Final diagnoses:  Laceration of left index finger without foreign body without damage to nail, initial encounter      NEW MEDICATIONS STARTED DURING THIS VISIT:  New Prescriptions   No medications on file     Note:  This document was prepared using Dragon voice recognition software and may include unintentional dictation errors.      Merrily Brittleifenbark, Jaclyn Andy, MD 04/20/18 [phone]

## 2019-03-16 NOTE — Progress Notes (Signed)
Chief Complaint  Patient presents with  . Gynecologic Exam  . [employer] Employee     HPI:      [name] is a 53 y.o. [phone] who LMP was No LMP recorded. (Menstrual status: Perimenopausal)., presents today for her annual examination.  Her menses were absent for 13 months and then had a period end of March, lasting 5-6 days. Dysmenorrhea none. She does not have intermenstrual bleeding. She does have tolerable vasomotor sx.   Sex activity: single partner, contraception - vasectomy. She does have vaginal dryness and uses lubricants with relief.  Last Pap: 02/18/18  Results were: no abnormalities /neg HPV DNA.  Hx of STDs: none  Last mammogram: 02/26/18 at [facility].  Results were: normal--routine follow-up in 12 months There is no FH of breast cancer. There is no FH of ovarian cancer. The patient does do self-breast exams.  Colonoscopy: colonoscopy 10 years ago without abnormalities. Repeat due in 10 yrs. Has appt with Dr. Marva Panda 7/20.  Tobacco use: The patient denies current or previous tobacco use. Alcohol use: none Exercise: moderately active  She does get adequate calcium and Vitamin D in her diet.  Labs with PCP.    Past Medical History:  Diagnosis Date  . Anxiety   . GERD (gastroesophageal reflux disease)   . GERD (gastroesophageal reflux disease)   . Heart murmur    followed by PCP  . Hyperlipidemia   . Hypertension   . Pre-diabetes   . Prediabetes     Past Surgical History:  Procedure Laterality Date  . CERVICAL DISC SURGERY  05/2016   anterior fusion  . CESAREAN SECTION    . COLONOSCOPY    . DILATION AND CURETTAGE OF UTERUS    . ESOPHAGOGASTRODUODENOSCOPY N/A 12/28/2015   Procedure: ESOPHAGOGASTRODUODENOSCOPY (EGD);  Surgeon: Wallace Cullens, MD;  Location: [facility];  Service: Gastroenterology;  Laterality: N/A;  . TENOTOMY Right    arm    History reviewed. No pertinent family history.  Social History   Socioeconomic History  .  Marital status: Married    Spouse name: Not on file  . Number of children: Not on file  . Years of education: Not on file  . Highest education level: Not on file  Occupational History  . Not on file  Social Needs  . Financial resource strain: Not on file  . Food insecurity:    Worry: Not on file    Inability: Not on file  . Transportation needs:    Medical: Not on file    Non-medical: Not on file  Tobacco Use  . Smoking status: Never Smoker  . Smokeless tobacco: Never Used  Substance and Sexual Activity  . Alcohol use: No  . Drug use: No  . Sexual activity: Yes  Lifestyle  . Physical activity:    Days per week: Not on file    Minutes per session: Not on file  . Stress: Not on file  Relationships  . Social connections:    Talks on phone: Not on file    Gets together: Not on file    Attends religious service: Not on file    Active member of club or organization: Not on file    Attends meetings of clubs or organizations: Not on file    Relationship status: Not on file  . Intimate partner violence:    Fear of current or ex partner: Not on file    Emotionally abused: Not on file  Physically abused: Not on file    Forced sexual activity: Not on file  Other Topics Concern  . Not on file  Social History Narrative  . Not on file    Current Outpatient Medications on File Prior to Visit  Medication Sig Dispense Refill  . Cholecalciferol (VITAMIN D3) 1000 units CAPS 2 (two) times daily.    . hydrochlorothiazide (HYDRODIURIL) 25 MG tablet 25 mg.    . losartan (COZAAR) 25 MG tablet Take 25 mg by mouth daily.    . metFORMIN (GLUCOPHAGE) 500 MG tablet Take by mouth 2 (two) times daily with a meal.    . mometasone (NASONEX) 50 MCG/ACT nasal spray NASONEX 50 MCG/ACT SUSP    . Multiple Vitamins-Minerals (SUPER THERA VITE M PO) Take by mouth daily.    Marland Kitchen omeprazole (PRILOSEC) 40 MG capsule omeprazole 20 mg tablet,delayed release  Take by oral route.    . pravastatin (PRAVACHOL)  40 MG tablet Take 40 mg by mouth daily.    . sucralfate (CARAFATE) 1 g tablet Take by mouth.    . polyethylene glycol-electrolytes (NULYTELY/GOLYTELY) 420 g solution TK UTD FOR COLONIC PREP    . polyethylene glycol-electrolytes (NULYTELY/GOLYTELY) 420 g solution Take as directed for colonic prep.     No current facility-administered medications on file prior to visit.     ROS:  Review of Systems  Constitutional: Negative for fatigue, fever and unexpected weight change.  Respiratory: Negative for cough, shortness of breath and wheezing.   Cardiovascular: Negative for chest pain, palpitations and leg swelling.  Gastrointestinal: Negative for blood in stool, constipation, diarrhea, nausea and vomiting.  Endocrine: Negative for cold intolerance, heat intolerance and polyuria.  Genitourinary: Negative for dyspareunia, dysuria, flank pain, frequency, genital sores, hematuria, menstrual problem, pelvic pain, urgency, vaginal bleeding, vaginal discharge and vaginal pain.  Musculoskeletal: Negative for back pain, joint swelling and myalgias.  Skin: Negative for rash.  Neurological: Negative for dizziness, syncope, light-headedness, numbness and headaches.  Hematological: Negative for adenopathy.  Psychiatric/Behavioral: Negative for agitation, confusion, sleep disturbance and suicidal ideas. The patient is not nervous/anxious.      Objective: BP 118/82   Ht 5\' 3"  (1.6 m)   Wt 195 lb 3.2 oz (88.5 kg)   BMI 34.58 kg/m    Physical Exam Constitutional:      Appearance: She is well-developed.  Genitourinary:     Vagina and uterus normal.     No vaginal discharge, erythema or tenderness.     No cervical motion tenderness or polyp.     Uterus is not enlarged or tender.     No right or left adnexal mass present.     Right adnexa not tender.     Left adnexa not tender.  Neck:     Musculoskeletal: Normal range of motion.     Thyroid: No thyromegaly.  Cardiovascular:     Rate and Rhythm:  Normal rate and regular rhythm.     Heart sounds: Normal heart sounds. No murmur.  Pulmonary:     Effort: Pulmonary effort is normal.     Breath sounds: Normal breath sounds.  Chest:     Breasts:        Right: No mass, nipple discharge, skin change or tenderness.        Left: No mass, nipple discharge, skin change or tenderness.  Abdominal:     Palpations: Abdomen is soft.     Tenderness: There is no abdominal tenderness. There is no guarding.  Musculoskeletal: Normal range of  motion.  Neurological:     Mental Status: She is alert and oriented to person, place, and time.     Cranial Nerves: No cranial nerve deficit.  Psychiatric:        Behavior: Behavior normal.  Vitals signs reviewed.     Assessment/Plan: Encounter for annual routine gynecological examination  Screening for breast cancer - Pt to sched mammo - Plan: MM 3D SCREEN BREAST BILATERAL  Screening for colon cancer - Pt has appt 7/20 with [facility]      GYN counsel breast self exam, mammography screening, menopause, adequate intake of calcium and vitamin D, diet and exercise     F/U  Return in about 1 year (around 03/16/2020).  Toye Rouillard B. Dietra Stokely, PA-C 03/17/2019 8:32 AM

## 2019-03-17 ENCOUNTER — Other Ambulatory Visit: Payer: Self-pay

## 2019-03-17 ENCOUNTER — Encounter: Payer: Self-pay | Admitting: Obstetrics and Gynecology

## 2019-03-17 ENCOUNTER — Ambulatory Visit (INDEPENDENT_AMBULATORY_CARE_PROVIDER_SITE_OTHER): Payer: Managed Care, Other (non HMO) | Admitting: Obstetrics and Gynecology

## 2019-03-17 VITALS — BP 118/82 | Ht 63.0 in | Wt 195.2 lb

## 2019-03-17 DIAGNOSIS — Z01419 Encounter for gynecological examination (general) (routine) without abnormal findings: Secondary | ICD-10-CM | POA: Diagnosis not present

## 2019-03-17 DIAGNOSIS — Z1211 Encounter for screening for malignant neoplasm of colon: Secondary | ICD-10-CM

## 2019-03-17 DIAGNOSIS — Z1239 Encounter for other screening for malignant neoplasm of breast: Secondary | ICD-10-CM

## 2019-03-17 NOTE — Patient Instructions (Signed)
I value your feedback and entrusting us with your care. If you get a [facility] patient survey, I would appreciate you taking the time to let us know about your experience today. Thank you!  [facility]: [phone]  [facility]: [phone]  

## 2019-03-18 ENCOUNTER — Encounter: Payer: Self-pay | Admitting: Obstetrics and Gynecology

## 2019-08-21 ENCOUNTER — Other Ambulatory Visit: Payer: Self-pay | Admitting: Family Medicine

## 2019-08-21 DIAGNOSIS — R1011 Right upper quadrant pain: Secondary | ICD-10-CM

## 2019-08-26 ENCOUNTER — Ambulatory Visit
Admission: RE | Admit: 2019-08-26 | Discharge: 2019-08-26 | Disposition: A | Discharge status: Home / Self Care | Payer: Managed Care, Other (non HMO) | Source: Ambulatory Visit | Attending: Family Medicine | Admitting: Family Medicine

## 2019-08-26 ENCOUNTER — Other Ambulatory Visit: Payer: Self-pay

## 2019-08-26 DIAGNOSIS — R1011 Right upper quadrant pain: Secondary | ICD-10-CM | POA: Diagnosis not present

## 2019-09-18 ENCOUNTER — Other Ambulatory Visit: Payer: Self-pay | Admitting: Gastroenterology

## 2019-09-18 DIAGNOSIS — R110 Nausea: Secondary | ICD-10-CM

## 2019-09-18 DIAGNOSIS — R1011 Right upper quadrant pain: Secondary | ICD-10-CM

## 2019-09-30 ENCOUNTER — Other Ambulatory Visit: Payer: Self-pay

## 2019-09-30 ENCOUNTER — Encounter
Admission: RE | Admit: 2019-09-30 | Discharge: 2019-09-30 | Disposition: A | Discharge status: Home / Self Care | Payer: Managed Care, Other (non HMO) | Source: Ambulatory Visit | Attending: Gastroenterology | Admitting: Gastroenterology

## 2019-09-30 DIAGNOSIS — R110 Nausea: Secondary | ICD-10-CM | POA: Diagnosis present

## 2019-09-30 DIAGNOSIS — R1011 Right upper quadrant pain: Secondary | ICD-10-CM | POA: Diagnosis present

## 2019-09-30 MED ORDER — SINCALIDE 5 MCG IJ SOLR
0.0200 ug/kg | Freq: Once | INTRAMUSCULAR | Status: AC
  Administered 2019-09-30: 1.8 ug via INTRAVENOUS
  Filled 2019-09-30: qty 5

## 2019-09-30 MED ORDER — TECHNETIUM TC 99M MEBROFENIN IV KIT
5.0000 | PACK | Freq: Once | INTRAVENOUS | Status: AC | PRN
  Administered 2019-09-30: 08:00:00 5.58 via INTRAVENOUS

## 2019-11-26 ENCOUNTER — Other Ambulatory Visit: Payer: Self-pay | Admitting: Obstetrics and Gynecology

## 2019-11-26 ENCOUNTER — Encounter: Payer: Self-pay | Admitting: Obstetrics and Gynecology

## 2019-11-26 MED ORDER — FLUCONAZOLE 150 MG PO TABS: 150.0000 mg | ORAL_TABLET | Freq: Once | ORAL | 0 refills | Status: AC

## 2019-11-26 NOTE — Progress Notes (Signed)
Rx diflucan for yeast vag sx. RTO if sx persist

## 2019-12-08 ENCOUNTER — Telehealth: Payer: Self-pay

## 2019-12-08 NOTE — Telephone Encounter (Signed)
Pt calling triage stating her yeast infection is not 100% gone. Does she need to be seen? Or do you want to send in another pill?

## 2019-12-08 NOTE — Telephone Encounter (Signed)
What sx is she still having? If just itching/burning, treat with over-the-counter hydrocortisone crm BID for 2-3 days to see if sx improve. If not, then pt needs appt. Thx.

## 2019-12-08 NOTE — Telephone Encounter (Signed)
Yellowish discharge, itching, burning, no odor. Pt will try the hydrocortisone cream and f/u prn.

## 2019-12-25 ENCOUNTER — Other Ambulatory Visit: Payer: Self-pay

## 2019-12-25 ENCOUNTER — Encounter: Payer: Self-pay | Admitting: Certified Nurse Midwife

## 2019-12-25 ENCOUNTER — Ambulatory Visit (INDEPENDENT_AMBULATORY_CARE_PROVIDER_SITE_OTHER): Payer: Managed Care, Other (non HMO) | Admitting: Certified Nurse Midwife

## 2019-12-25 VITALS — BP 140/86 | HR 66 | Temp 98.2°F | Ht 63.0 in | Wt 197.0 lb

## 2019-12-25 DIAGNOSIS — N898 Other specified noninflammatory disorders of vagina: Secondary | ICD-10-CM

## 2019-12-25 DIAGNOSIS — N952 Postmenopausal atrophic vaginitis: Secondary | ICD-10-CM | POA: Diagnosis not present

## 2019-12-25 DIAGNOSIS — L292 Pruritus vulvae: Secondary | ICD-10-CM

## 2019-12-25 MED ORDER — FLUCONAZOLE 150 MG PO TABS
ORAL_TABLET | ORAL | 0 refills | Status: DC
Start: 2019-12-25 — End: 2020-02-09

## 2019-12-25 MED ORDER — ESTROGENS, CONJUGATED 0.625 MG/GM VA CREA: TOPICAL_CREAM | VAGINAL | 0 refills | Status: DC

## 2019-12-25 MED ORDER — METRONIDAZOLE 0.75 % VA GEL: 1.0000 | Freq: Every day | VAGINAL | 0 refills | Status: DC

## 2019-12-27 ENCOUNTER — Encounter: Payer: Self-pay | Admitting: Certified Nurse Midwife

## 2019-12-27 DIAGNOSIS — K219 Gastro-esophageal reflux disease without esophagitis: Secondary | ICD-10-CM | POA: Insufficient documentation

## 2019-12-27 DIAGNOSIS — I10 Essential (primary) hypertension: Secondary | ICD-10-CM | POA: Insufficient documentation

## 2019-12-27 DIAGNOSIS — R7303 Prediabetes: Secondary | ICD-10-CM | POA: Insufficient documentation

## 2019-12-27 DIAGNOSIS — N952 Postmenopausal atrophic vaginitis: Secondary | ICD-10-CM | POA: Insufficient documentation

## 2019-12-27 DIAGNOSIS — E785 Hyperlipidemia, unspecified: Secondary | ICD-10-CM | POA: Insufficient documentation

## 2019-12-27 LAB — POCT WET PREP (WET MOUNT): Trichomonas Wet Prep HPF POC: ABSENT

## 2019-12-27 NOTE — Progress Notes (Signed)
Obstetrics & Gynecology Office Visit   Chief Complaint:  Chief Complaint  Patient presents with  . Gynecologic Exam    yeast inf-burning, yellow d/c    History of Present Illness: postmenopausal female with LMP February 2020 presents with complaints of vulvovaginal burinng and itching and a yellowish green discharge. She first noticed these symptoms in February and she self diagnosed a yeast infection. She took Monistat 3 with little relief of symptoms. She called [facility] and was treated with Diflucan 150 mgm x1 dose. She also changed her soap to Oil of Olay sensitive soap. Had some transient relief of symptoms until yesterday.  She also reports some pink blood on her panty liner recently. Uses lubricants for vaginal dryness with intercourse.  Medical history remarkable for prediabetes, hypertension and GERD. Last annual gyn exam was June 2020 Review of Systems:  Review of Systems  Constitutional: Negative for chills, fever and weight loss.  HENT: Negative for congestion, sinus pain and sore throat.   Eyes: Negative for blurred vision and pain.  Respiratory: Negative for hemoptysis, shortness of breath and wheezing.   Cardiovascular: Negative for chest pain, palpitations and leg swelling.  Gastrointestinal: Negative for abdominal pain, blood in stool, diarrhea, heartburn, nausea and vomiting.  Genitourinary: Negative for dysuria, frequency, hematuria and urgency.       Positive for vulvovaginal burning and yellowish green discharge  Musculoskeletal: Negative for back pain, joint pain and myalgias.  Skin: Negative for itching and rash.  Neurological: Negative for dizziness, tingling and headaches.  Endo/Heme/Allergies: Positive for environmental allergies (and sneezing). Negative for polydipsia. Does not bruise/bleed easily.       Negative for hot flashes   Psychiatric/Behavioral: Negative for depression. The patient is not nervous/anxious and does not have insomnia.      Past  Medical History:  Past Medical History:  Diagnosis Date  . Anxiety   . GERD (gastroesophageal reflux disease)   . GERD (gastroesophageal reflux disease)   . Heart murmur    followed by PCP  . Hyperlipidemia   . Hypertension   . Pre-diabetes   . Prediabetes     Past Surgical History:  Past Surgical History:  Procedure Laterality Date  . CERVICAL DISC SURGERY  05/2016   anterior fusion  . CESAREAN SECTION    . COLONOSCOPY    . DILATION AND CURETTAGE OF UTERUS    . ESOPHAGOGASTRODUODENOSCOPY N/A 12/28/2015   Procedure: ESOPHAGOGASTRODUODENOSCOPY (EGD);  Surgeon: Hulen Luster, MD;  Location: [facility];  Service: Gastroenterology;  Laterality: N/A;  . TENOTOMY Right    arm    Gynecologic History: No LMP recorded. (Menstrual status: Perimenopausal).  Obstetric History: [id number]  Family History:  No family history on file.  Social History:  Social History   Socioeconomic History  . Marital status: Married    Spouse name: Not on file  . Number of children: 3  . Years of education: Not on file  . Highest education level: Not on file  Occupational History  . Not on file  Tobacco Use  . Smoking status: Never Smoker  . Smokeless tobacco: Never Used  Substance and Sexual Activity  . Alcohol use: No  . Drug use: No  . Sexual activity: Yes    Birth control/protection: Post-menopausal  Other Topics Concern  . Not on file  Social History Narrative  . Not on file   Social Determinants of Health   Financial Resource Strain:   . Difficulty of Paying Living Expenses:  Food Insecurity:   . Worried About Programme researcher, broadcasting/film/video in the Last Year:   . Barista in the Last Year:   Transportation Needs:   . Freight forwarder (Medical):   Marland Kitchen Lack of Transportation (Non-Medical):   Physical Activity:   . Days of Exercise per Week:   . Minutes of Exercise per Session:   Stress:   . Feeling of Stress :   Social Connections:   . Frequency of Communication with  Friends and Family:   . Frequency of Social Gatherings with Friends and Family:   . Attends Religious Services:   . Active Member of Clubs or Organizations:   . Attends Banker Meetings:   Marland Kitchen Marital Status:   Intimate Partner Violence:   . Fear of Current or Ex-Partner:   . Emotionally Abused:   Marland Kitchen Physically Abused:   . Sexually Abused:     Allergies:  Allergies  Allergen Reactions  . Other Rash    Bandaids  . Lisinopril Cough  . Sulfa Antibiotics   . Latex Itching and Rash    Medications: Prior to Admission medications   Medication Sig Start Date End Date Taking? Authorizing Provider  Cholecalciferol (VITAMIN D3) 1000 units CAPS 2 (two) times daily. 01/27/13  Yes [provider]  ezetimibe (ZETIA) 10 MG tablet Take 1 tablet by mouth daily. 11/18/19 11/17/20 Yes [provider]  hydrochlorothiazide (HYDRODIURIL) 25 MG tablet 25 mg. 09/15/10  Yes [provider]  losartan (COZAAR) 25 MG tablet Take 25 mg by mouth daily.   Yes [provider]  metFORMIN (GLUCOPHAGE-XR) 500 MG 24 hr tablet Take 500 mg by mouth 2 (two) times daily. 11/27/19  Yes [provider]  mometasone (NASONEX) 50 MCG/ACT nasal spray NASONEX 50 MCG/ACT SUSP 11/08/15  Yes [provider]  Multiple Vitamins-Minerals (SUPER THERA VITE M PO) Take by mouth daily.   Yes [provider]  omeprazole (PRILOSEC) 40 MG capsule omeprazole 20 mg tablet,delayed release  Take by oral route.   Yes [provider]  pravastatin (PRAVACHOL) 40 MG tablet Take 40 mg by mouth daily.   Yes [provider]  sucralfate (CARAFATE) 1 g tablet Take by mouth. 02/13/18  Yes [provider]   Physical Exam Vitals: BP 140/86   Pulse 66   Temp 98.2 F (36.8 C)   Ht 5\' 3"  (1.6 m)   Wt 197 lb (89.4 kg)   BMI 34.90 kg/m    Physical Exam  Constitutional: She is oriented to person, place, and time. She appears well-developed and well-nourished.  No distress.  Genitourinary:    Genitourinary Comments: Vulva: mild inflammation of vestibule Vagina: thick yellowish green tischarge Cervix: no lesions, no polyps    Neurological: She is alert and oriented to person, place, and time.  Psychiatric: She has a normal mood and affect.   Results for orders placed or performed in visit on 12/25/19 (from the past 48 hour(s))  POCT Wet Prep 02/24/20 [facility])     Status: Abnormal   Collection Time: 12/27/19 10:46 AM  Result Value Ref Range   Source Wet Prep POC vagina     Comment: many basilar and parabasilar cell   WBC, Wet Prep HPF POC TNTC    Bacteria Wet Prep HPF POC Many (A) Few   BACTERIA WET PREP MORPHOLOGY POC     Clue Cells Wet Prep HPF POC None None   Clue Cells Wet Prep Whiff POC  Yeast Wet Prep HPF POC None None   KOH Wet Prep POC     Trichomonas Wet Prep HPF POC Absent Absent    Assessment: 54 y.o. [id number] with atrophic vaginitis    Plan: Metrogel vaginal gel-insert one applicatorful qhs x 5 days Then Premarin cream 0.5 gm qhs x 2 weeks then decrease to 3 times a week Diflucan 150 mgm  Every 3 days x 2 doses if develops symptoms of yeast after Metrogel. Follow up at annual exam in June 2021 and prn  Farrel Conners, CNM

## 2020-02-08 NOTE — Progress Notes (Signed)
Frazier Richards, MD   Chief Complaint  Patient presents with  . Vaginal Itching    yellowish discharge and irritation, no odor on/off Jan/Feb  . [employer] Employee    HPI:      [name] is a 54 y.o. [phone] who LMP was No LMP recorded. (Menstrual status: Perimenopausal)., presents today for persistent increased yellow vag d/c, since 2/21. Has vag irritation, no odor. Treated for yeast sx with monistat-3 and diflucan 2/21 with some sx improvement. Sx recurred and treated for yeast and BV 3/21 by [facility] and given premarin vag crm. Sx improve slightly but then recur. Pt is not sex active currently due to vag discomfort from sx. No new partners. No new pelvic pain, LBP, fevers. Uses water to clean now, uses scented downy in dryer, no wipes. Uses pantyliners prn.  Past Medical History:  Diagnosis Date  . Anxiety   . GERD (gastroesophageal reflux disease)   . GERD (gastroesophageal reflux disease)   . Heart murmur    followed by PCP  . Hyperlipidemia   . Hypertension   . Pre-diabetes   . Prediabetes     Past Surgical History:  Procedure Laterality Date  . CERVICAL DISC SURGERY  05/2016   anterior fusion  . CESAREAN SECTION    . COLONOSCOPY    . DILATION AND CURETTAGE OF UTERUS    . ESOPHAGOGASTRODUODENOSCOPY N/A 12/28/2015   Procedure: ESOPHAGOGASTRODUODENOSCOPY (EGD);  Surgeon: Hulen Luster, MD;  Location: [facility];  Service: Gastroenterology;  Laterality: N/A;  . TENOTOMY Right    arm    History reviewed. No pertinent family history.  Social History   Socioeconomic History  . Marital status: Married    Spouse name: Not on file  . Number of children: 3  . Years of education: Not on file  . Highest education level: Not on file  Occupational History  . Not on file  Tobacco Use  . Smoking status: Never Smoker  . Smokeless tobacco: Never Used  Substance and Sexual Activity  . Alcohol use: No  . Drug use: No  . Sexual activity: Yes    Birth  control/protection: Post-menopausal  Other Topics Concern  . Not on file  Social History Narrative  . Not on file   Social Determinants of Health   Financial Resource Strain:   . Difficulty of Paying Living Expenses:   Food Insecurity:   . Worried About Charity fundraiser in the Last Year:   . Arboriculturist in the Last Year:   Transportation Needs:   . Film/video editor (Medical):   Marland Kitchen Lack of Transportation (Non-Medical):   Physical Activity:   . Days of Exercise per Week:   . Minutes of Exercise per Session:   Stress:   . Feeling of Stress :   Social Connections:   . Frequency of Communication with Friends and Family:   . Frequency of Social Gatherings with Friends and Family:   . Attends Religious Services:   . Active Member of Clubs or Organizations:   . Attends Archivist Meetings:   Marland Kitchen Marital Status:   Intimate Partner Violence:   . Fear of Current or Ex-Partner:   . Emotionally Abused:   Marland Kitchen Physically Abused:   . Sexually Abused:     Outpatient Medications Prior to Visit  Medication Sig Dispense Refill  . Cholecalciferol (VITAMIN D3) 1000 units CAPS 2 (two) times daily.    Marland Kitchen conjugated estrogens (  PREMARIN) vaginal cream 0.5 gram vaginally nightly at bedtime for 2 weeks, then 0.5 gram vaginally 3 times a week at bedtime 30 g 0  . ezetimibe (ZETIA) 10 MG tablet Take 1 tablet by mouth daily.    . hydrochlorothiazide (HYDRODIURIL) 25 MG tablet 25 mg.    . losartan (COZAAR) 25 MG tablet Take 25 mg by mouth daily.    . metFORMIN (GLUCOPHAGE-XR) 500 MG 24 hr tablet Take 500 mg by mouth 2 (two) times daily.    . metroNIDAZOLE (METROGEL) 0.75 % vaginal gel Place 1 Applicatorful vaginally at bedtime. 70 g 0  . mometasone (NASONEX) 50 MCG/ACT nasal spray NASONEX 50 MCG/ACT SUSP    . Multiple Vitamins-Minerals (SUPER THERA VITE M PO) Take by mouth daily.    Marland Kitchen omeprazole (PRILOSEC) 40 MG capsule omeprazole 20 mg tablet,delayed release  Take by oral route.      . pravastatin (PRAVACHOL) 40 MG tablet Take 40 mg by mouth daily.    . sucralfate (CARAFATE) 1 g tablet Take by mouth.    . fluconazole (DIFLUCAN) 150 MG tablet Take one tablets every 3 days x 2 doses 2 tablet 0   No facility-administered medications prior to visit.      ROS:  Review of Systems  Constitutional: Negative for fever.  Gastrointestinal: Negative for blood in stool, constipation, diarrhea, nausea and vomiting.  Genitourinary: Positive for vaginal discharge. Negative for dyspareunia, dysuria, flank pain, frequency, hematuria, urgency, vaginal bleeding and vaginal pain.  Musculoskeletal: Negative for back pain.  Skin: Negative for rash.  BREAST: No symptoms   OBJECTIVE:   Vitals:  BP 130/80   Ht 5\' 3"  (1.6 m)   Wt 197 lb (89.4 kg)   BMI 34.90 kg/m   Physical Exam Vitals reviewed.  Constitutional:      Appearance: She is well-developed.  Pulmonary:     Effort: Pulmonary effort is normal.  Genitourinary:    General: Normal vulva.     Pubic Area: No rash.      Labia:        Right: No rash, tenderness or lesion.        Left: No rash, tenderness or lesion.      Vagina: Vaginal discharge and erythema present. No tenderness.     Cervix: Normal.     Uterus: Normal. Not enlarged and not tender.      Adnexa: Right adnexa normal and left adnexa normal.       Right: No mass or tenderness.         Left: No mass or tenderness.    Musculoskeletal:        General: Normal range of motion.     Cervical back: Normal range of motion.  Skin:    General: Skin is warm and dry.  Neurological:     General: No focal deficit present.     Mental Status: She is alert and oriented to person, place, and time.  Psychiatric:        Mood and Affect: Mood normal.        Behavior: Behavior normal.        Thought Content: Thought content normal.        Judgment: Judgment normal.     Results: Results for orders placed or performed in visit on 02/09/20 (from the past 24 hour(s))   POCT Wet Prep with KOH     Status: Abnormal   Collection Time: 02/09/20 11:33 AM  Result Value Ref Range   Trichomonas, UA Negative  Clue Cells Wet Prep HPF POC neg    Epithelial Wet Prep HPF POC     Yeast Wet Prep HPF POC neg    Bacteria Wet Prep HPF POC     RBC Wet Prep HPF POC     WBC Wet Prep HPF POC POS    KOH Prep POC Negative Negative     Assessment/Plan: Vaginal discharge - Plan: NuSwab Vaginitis Plus (VG+), POCT Wet Prep with KOH; Pos sx/exam, neg wet prep. Check nuswab. Will f/u with results. If neg, treat with doxy for cervicitis due to white blood cells on wet prep. Dove sensitive skin soap/water to clean, line dry underwear, no damp pads/underwear.      Return if symptoms worsen or fail to improve.  Cort Dragoo B. Sherwin Hollingshed, PA-C 02/09/2020 11:37 AM

## 2020-02-09 ENCOUNTER — Other Ambulatory Visit: Payer: Self-pay

## 2020-02-09 ENCOUNTER — Ambulatory Visit: Payer: Managed Care, Other (non HMO) | Admitting: Obstetrics and Gynecology

## 2020-02-09 ENCOUNTER — Encounter: Payer: Self-pay | Admitting: Obstetrics and Gynecology

## 2020-02-09 VITALS — BP 130/80 | Ht 63.0 in | Wt 197.0 lb

## 2020-02-09 DIAGNOSIS — N898 Other specified noninflammatory disorders of vagina: Secondary | ICD-10-CM

## 2020-02-09 LAB — POCT WET PREP WITH KOH
Clue Cells Wet Prep HPF POC: NEGATIVE
KOH Prep POC: NEGATIVE
Trichomonas, UA: NEGATIVE
WBC Wet Prep HPF POC: POSITIVE
Yeast Wet Prep HPF POC: NEGATIVE

## 2020-02-09 NOTE — Patient Instructions (Signed)
I value your feedback and entrusting us with your care. If you get a [facility] patient survey, I would appreciate you taking the time to let us know about your experience today. Thank you!  As of September 24, 2019, your lab results will be released to your MyChart immediately, before I even have a chance to see them. Please give me time to review them and contact you if there are any abnormalities. Thank you for your patience.  

## 2020-02-12 LAB — NUSWAB VAGINITIS PLUS (VG+)
Candida albicans, NAA: NEGATIVE
Candida glabrata, NAA: NEGATIVE
Chlamydia trachomatis, NAA: NEGATIVE
Neisseria gonorrhoeae, NAA: NEGATIVE
Trich vag by NAA: NEGATIVE

## 2020-03-21 ENCOUNTER — Ambulatory Visit (INDEPENDENT_AMBULATORY_CARE_PROVIDER_SITE_OTHER): Payer: Managed Care, Other (non HMO) | Admitting: Obstetrics and Gynecology

## 2020-03-21 ENCOUNTER — Encounter: Payer: Self-pay | Admitting: Obstetrics and Gynecology

## 2020-03-21 ENCOUNTER — Other Ambulatory Visit: Payer: Self-pay

## 2020-03-21 VITALS — BP 120/70 | Ht 63.0 in | Wt 199.0 lb

## 2020-03-21 DIAGNOSIS — Z01419 Encounter for gynecological examination (general) (routine) without abnormal findings: Secondary | ICD-10-CM | POA: Diagnosis not present

## 2020-03-21 DIAGNOSIS — Z1231 Encounter for screening mammogram for malignant neoplasm of breast: Secondary | ICD-10-CM

## 2020-03-21 NOTE — Progress Notes (Signed)
Chief Complaint  Patient presents with  . Gynecologic Exam  . [employer] Employee     HPI:      [name] is a 54 y.o. [phone] who LMP was No LMP recorded. (Menstrual status: Perimenopausal)., presents today for her annual examination.  Her menses are absent due to menopause. Denies PMB. She has tolerable vasomotor sx.   Sex activity: single partner, contraception - vasectomy. She does have vaginal dryness and uses lubricants with relief.  Last Pap: 02/18/18  Results were: no abnormalities /neg HPV DNA.  Hx of STDs: none  Vag d/c sx from 3/21-4/21 finally resolved on their own. Had neg nuswab testing.   Last mammogram: 03/17/19 at [facility].  Results were: normal--routine follow-up in 12 months There is no FH of breast cancer. There is no FH of ovarian cancer. The patient does do self-breast exams.  Colonoscopy: colonoscopy 7/20 with Dr. Marva Panda at [facility]. Repeat due after 5 yrs.  Tobacco use: The patient denies current or previous tobacco use. Alcohol use: none No drug use Exercise: moderately active  She does get adequate calcium and Vitamin D in her diet.  Labs with PCP.    Past Medical History:  Diagnosis Date  . Anxiety   . GERD (gastroesophageal reflux disease)   . GERD (gastroesophageal reflux disease)   . Heart murmur    followed by PCP  . Hyperlipidemia   . Hypertension   . Pre-diabetes   . Prediabetes     Past Surgical History:  Procedure Laterality Date  . CERVICAL DISC SURGERY  05/2016   anterior fusion  . CESAREAN SECTION    . COLONOSCOPY    . DILATION AND CURETTAGE OF UTERUS    . ESOPHAGOGASTRODUODENOSCOPY N/A 12/28/2015   Procedure: ESOPHAGOGASTRODUODENOSCOPY (EGD);  Surgeon: Wallace Cullens, MD;  Location: [facility];  Service: Gastroenterology;  Laterality: N/A;  . TENOTOMY Right    arm    History reviewed. No pertinent family history.  Social History   Socioeconomic History  . Marital status: Married    Spouse name: Not on  file  . Number of children: 3  . Years of education: Not on file  . Highest education level: Not on file  Occupational History  . Not on file  Tobacco Use  . Smoking status: Never Smoker  . Smokeless tobacco: Never Used  Substance and Sexual Activity  . Alcohol use: No  . Drug use: No  . Sexual activity: Yes    Birth control/protection: Post-menopausal  Other Topics Concern  . Not on file  Social History Narrative  . Not on file   Social Determinants of Health   Financial Resource Strain:   . Difficulty of Paying Living Expenses:   Food Insecurity:   . Worried About Programme researcher, broadcasting/film/video in the Last Year:   . Barista in the Last Year:   Transportation Needs:   . Freight forwarder (Medical):   Marland Kitchen Lack of Transportation (Non-Medical):   Physical Activity:   . Days of Exercise per Week:   . Minutes of Exercise per Session:   Stress:   . Feeling of Stress :   Social Connections:   . Frequency of Communication with Friends and Family:   . Frequency of Social Gatherings with Friends and Family:   . Attends Religious Services:   . Active Member of Clubs or Organizations:   . Attends Banker Meetings:   Marland Kitchen Marital Status:  Intimate Partner Violence:   . Fear of Current or Ex-Partner:   . Emotionally Abused:   Marland Kitchen Physically Abused:   . Sexually Abused:     Current Outpatient Medications on File Prior to Visit  Medication Sig Dispense Refill  . Cholecalciferol (VITAMIN D3) 1000 units CAPS 2 (two) times daily.    . hydrochlorothiazide (HYDRODIURIL) 25 MG tablet 25 mg.    . losartan (COZAAR) 50 MG tablet Take 50 mg by mouth daily.    . meloxicam (MOBIC) 7.5 MG tablet meloxicam 7.5 mg tablet  TAKE 1 TABLET BY MOUTH TWICE DAILY    . metFORMIN (GLUCOPHAGE-XR) 500 MG 24 hr tablet Take 500 mg by mouth 2 (two) times daily.    . mometasone (NASONEX) 50 MCG/ACT nasal spray NASONEX 50 MCG/ACT SUSP    . Multiple Vitamins-Minerals (SUPER THERA VITE M PO) Take  by mouth daily.    Marland Kitchen omeprazole (PRILOSEC) 40 MG capsule omeprazole 20 mg tablet,delayed release  Take by oral route.    . pravastatin (PRAVACHOL) 40 MG tablet Take 40 mg by mouth daily.    . sucralfate (CARAFATE) 1 g tablet Take by mouth.     No current facility-administered medications on file prior to visit.    ROS:  Review of Systems  Constitutional: Negative for fatigue, fever and unexpected weight change.  Respiratory: Negative for cough, shortness of breath and wheezing.   Cardiovascular: Negative for chest pain, palpitations and leg swelling.  Gastrointestinal: Negative for blood in stool, constipation, diarrhea, nausea and vomiting.  Endocrine: Negative for cold intolerance, heat intolerance and polyuria.  Genitourinary: Negative for dyspareunia, dysuria, flank pain, frequency, genital sores, hematuria, menstrual problem, pelvic pain, urgency, vaginal bleeding, vaginal discharge and vaginal pain.  Musculoskeletal: Negative for back pain, joint swelling and myalgias.  Skin: Negative for rash.  Neurological: Negative for dizziness, syncope, light-headedness, numbness and headaches.  Hematological: Negative for adenopathy.  Psychiatric/Behavioral: Negative for agitation, confusion, sleep disturbance and suicidal ideas. The patient is not nervous/anxious.      Objective: BP 120/70   Ht 5\' 3"  (1.6 m)   Wt 199 lb (90.3 kg)   BMI 35.25 kg/m    Physical Exam Constitutional:      Appearance: She is well-developed.  Genitourinary:     Vulva, vagina, uterus, right adnexa and left adnexa normal.     No vulval lesion or tenderness noted.     No vaginal discharge, erythema or tenderness.     No cervical motion tenderness or polyp.     Uterus is not enlarged or tender.     No right or left adnexal mass present.     Right adnexa not tender.     Left adnexa not tender.  Neck:     Thyroid: No thyromegaly.  Cardiovascular:     Rate and Rhythm: Normal rate and regular rhythm.      Heart sounds: Normal heart sounds. No murmur.  Pulmonary:     Effort: Pulmonary effort is normal.     Breath sounds: Normal breath sounds.  Chest:     Breasts:        Right: No mass, nipple discharge, skin change or tenderness.        Left: No mass, nipple discharge, skin change or tenderness.  Abdominal:     Palpations: Abdomen is soft.     Tenderness: There is no abdominal tenderness. There is no guarding.  Musculoskeletal:        General: Normal range of motion.  Cervical back: Normal range of motion.  Neurological:     General: No focal deficit present.     Mental Status: She is alert and oriented to person, place, and time.     Cranial Nerves: No cranial nerve deficit.  Skin:    General: Skin is warm and dry.  Psychiatric:        Mood and Affect: Mood normal.        Behavior: Behavior normal.        Thought Content: Thought content normal.        Judgment: Judgment normal.  Vitals reviewed.     Assessment/Plan: Encounter for annual routine gynecological examination  Encounter for screening mammogram for malignant neoplasm of breast - Plan: MM 3D SCREEN BREAST BILATERAL; pt to sched mammo      GYN counsel breast self exam, mammography screening, menopause, adequate intake of calcium and vitamin D, diet and exercise     F/U  Return in about 1 year (around 03/21/2021).  Laconya Clere B. Chandlor Noecker, PA-C 03/21/2020 3:55 PM

## 2020-03-21 NOTE — Patient Instructions (Signed)
I value your feedback and entrusting us with your care. If you get a [facility] patient survey, I would appreciate you taking the time to let us know about your experience today. Thank you!  As of September 24, 2019, your lab results will be released to your MyChart immediately, before I even have a chance to see them. Please give me time to review them and contact you if there are any abnormalities. Thank you for your patience.     [facility]: [phone]  

## 2020-03-28 ENCOUNTER — Encounter: Payer: Self-pay | Admitting: Obstetrics and Gynecology

## 2020-03-29 ENCOUNTER — Encounter: Payer: Self-pay | Admitting: Obstetrics and Gynecology

## 2020-12-12 DIAGNOSIS — Z0000 Encounter for general adult medical examination without abnormal findings: Secondary | ICD-10-CM | POA: Insufficient documentation

## 2020-12-12 DIAGNOSIS — E118 Type 2 diabetes mellitus with unspecified complications: Secondary | ICD-10-CM | POA: Insufficient documentation

## 2021-01-23 ENCOUNTER — Ambulatory Visit (INDEPENDENT_AMBULATORY_CARE_PROVIDER_SITE_OTHER): Payer: Managed Care, Other (non HMO)

## 2021-01-23 ENCOUNTER — Ambulatory Visit: Payer: Managed Care, Other (non HMO) | Admitting: Podiatry

## 2021-01-23 ENCOUNTER — Encounter: Payer: Self-pay | Admitting: Podiatry

## 2021-01-23 ENCOUNTER — Other Ambulatory Visit: Payer: Self-pay | Admitting: Podiatry

## 2021-01-23 ENCOUNTER — Other Ambulatory Visit: Payer: Self-pay

## 2021-01-23 DIAGNOSIS — M898X7 Other specified disorders of bone, ankle and foot: Secondary | ICD-10-CM

## 2021-01-23 DIAGNOSIS — M2011 Hallux valgus (acquired), right foot: Secondary | ICD-10-CM

## 2021-01-23 DIAGNOSIS — M6740 Ganglion, unspecified site: Secondary | ICD-10-CM | POA: Diagnosis not present

## 2021-01-23 NOTE — Progress Notes (Signed)
Subjective:  Patient ID: Alexandra Stone, female    DOB: Sep 20, 1966,  MRN: [id number] HPI Chief Complaint  Patient presents with  . Callouses    Patient presents today for small knot on top of right hallux x 2-3 years.  She says it has become larger and hard to touch.  She says its tender to touch at times and has pain in joint at times   No treatment done    55 y.o. female presents with the above complaint.   ROS: Denies fever chills nausea vomiting muscle aches pains calf pain back pain chest pain shortness of breath.  Past Medical History:  Diagnosis Date  . Anxiety   . GERD (gastroesophageal reflux disease)   . GERD (gastroesophageal reflux disease)   . Heart murmur    followed by PCP  . Hyperlipidemia   . Hypertension   . Pre-diabetes   . Prediabetes    Past Surgical History:  Procedure Laterality Date  . CERVICAL DISC SURGERY  05/2016   anterior fusion  . CESAREAN SECTION    . COLONOSCOPY    . DILATION AND CURETTAGE OF UTERUS    . ESOPHAGOGASTRODUODENOSCOPY N/A 12/28/2015   Procedure: ESOPHAGOGASTRODUODENOSCOPY (EGD);  Surgeon: Wallace Cullens, MD;  Location: [facility];  Service: Gastroenterology;  Laterality: N/A;  . TENOTOMY Right    arm    Current Outpatient Medications:  .  Cholecalciferol (VITAMIN D3) 1000 units CAPS, 2 (two) times daily., Disp: , Rfl:  .  gentamicin ointment (GARAMYCIN) 0.1 %, Apply topically 3 (three) times daily., Disp: , Rfl:  .  hydrochlorothiazide (HYDRODIURIL) 25 MG tablet, 25 mg., Disp: , Rfl:  .  losartan (COZAAR) 50 MG tablet, Take 50 mg by mouth daily., Disp: , Rfl:  .  meloxicam (MOBIC) 7.5 MG tablet, meloxicam 7.5 mg tablet  TAKE 1 TABLET BY MOUTH TWICE DAILY, Disp: , Rfl:  .  metFORMIN (GLUCOPHAGE-XR) 500 MG 24 hr tablet, Take 500 mg by mouth 2 (two) times daily., Disp: , Rfl:  .  mometasone (NASONEX) 50 MCG/ACT nasal spray, NASONEX 50 MCG/ACT SUSP, Disp: , Rfl:  .  Multiple Vitamins-Minerals (SUPER THERA VITE M PO), Take by  mouth daily., Disp: , Rfl:  .  omeprazole (PRILOSEC) 40 MG capsule, omeprazole 20 mg tablet,delayed release  Take by oral route., Disp: , Rfl:  .  pravastatin (PRAVACHOL) 40 MG tablet, Take 40 mg by mouth daily., Disp: , Rfl:  .  RYBELSUS 7 MG TABS, Take 1 tablet by mouth daily., Disp: , Rfl:  .  sucralfate (CARAFATE) 1 g tablet, Take by mouth., Disp: , Rfl:   Allergies  Allergen Reactions  . Other Rash    Bandaids  . Ezetimibe Other (See Comments)  . Lisinopril Cough  . Sulfa Antibiotics   . Latex Itching and Rash   Review of Systems Objective:  There were no vitals filed for this visit.  General: Well developed, nourished, in no acute distress, alert and oriented x3   Dermatological: Skin is warm, dry and supple bilateral. Nails x 10 are well maintained; remaining integument appears unremarkable at this time. There are no open sores, no preulcerative lesions, no rash or signs of infection present.  Appears to be a small mucoid cyst on the proximal medial margin of the toe.  Vascular: Dorsalis Pedis artery and Posterior Tibial artery pedal pulses are 2/4 bilateral with immedate capillary fill time. Pedal hair growth present. No varicosities and no lower extremity edema present bilateral.   Neruologic:  Grossly intact via light touch bilateral. Vibratory intact via tuning fork bilateral. Protective threshold with Semmes Wienstein monofilament intact to all pedal sites bilateral. Patellar and Achilles deep tendon reflexes 2+ bilateral. No Babinski or clonus noted bilateral.   Musculoskeletal: No gross boney pedal deformities bilateral. No pain, crepitus, or limitation noted with foot and ankle range of motion bilateral. Muscular strength 5/5 in all groups tested bilateral.  Gait: Unassisted, Nonantalgic.    Radiographs:  Radiographs taken today demonstrate an osseously mature individual area in question is of the distal phalanx of the hallux where there appears to be a small dorsally  oriented spur just distal to the chondral cartilage or subchondral bone medially.  This coincides with the area of question.  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  I will do this on Friday at the surgery center in [place] I discussed the possible postop complications which may include but are not limited to postop pain bleeding swelling infection recurrence need for further surgery overcorrection under correction loss of digit loss of limb loss of life.  Follow-up with her at the time of surgery.     Alexandra Brenner T. Okay, [place]

## 2021-01-24 ENCOUNTER — Telehealth: Payer: Self-pay | Admitting: Urology

## 2021-01-24 NOTE — Telephone Encounter (Signed)
DOS - 02/10/21  EXC GANGLION TOE 1ST LEFT --- 89211 EXOSTECTOMY 1ST LEFT --- 94174  [employer] EFFECTIVE DATE - 10/16/19  PER [employer]'S ATOMATIVE SYSTEM FOR CPT CODES 08144 AND 81856 NO PRIOR AUTH IS REQUIRED. REF #'S - [id number]

## 2021-01-30 ENCOUNTER — Encounter: Payer: Self-pay | Admitting: Podiatry

## 2021-02-06 DIAGNOSIS — M79676 Pain in unspecified toe(s): Secondary | ICD-10-CM

## 2021-02-08 ENCOUNTER — Other Ambulatory Visit: Payer: Self-pay | Admitting: Podiatry

## 2021-02-08 MED ORDER — HYDROCODONE-ACETAMINOPHEN 10-325 MG PO TABS: 1.0000 | ORAL_TABLET | Freq: Four times a day (QID) | ORAL | 0 refills | Status: AC | PRN

## 2021-02-08 MED ORDER — ONDANSETRON HCL 4 MG PO TABS: 4.0000 mg | ORAL_TABLET | Freq: Three times a day (TID) | ORAL | 0 refills | Status: DC | PRN

## 2021-02-08 MED ORDER — CEPHALEXIN 500 MG PO CAPS: 500.0000 mg | ORAL_CAPSULE | Freq: Three times a day (TID) | ORAL | 0 refills | Status: DC

## 2021-02-10 DIAGNOSIS — M25775 Osteophyte, left foot: Secondary | ICD-10-CM | POA: Diagnosis not present

## 2021-02-10 DIAGNOSIS — M67472 Ganglion, left ankle and foot: Secondary | ICD-10-CM | POA: Diagnosis not present

## 2021-02-15 ENCOUNTER — Ambulatory Visit (INDEPENDENT_AMBULATORY_CARE_PROVIDER_SITE_OTHER): Payer: Managed Care, Other (non HMO) | Admitting: Podiatry

## 2021-02-15 ENCOUNTER — Other Ambulatory Visit: Payer: Self-pay

## 2021-02-15 ENCOUNTER — Ambulatory Visit (INDEPENDENT_AMBULATORY_CARE_PROVIDER_SITE_OTHER): Payer: Managed Care, Other (non HMO)

## 2021-02-15 DIAGNOSIS — M898X7 Other specified disorders of bone, ankle and foot: Secondary | ICD-10-CM

## 2021-02-15 NOTE — Progress Notes (Signed)
She presents today for her first postop visit status post exostectomy excision soft tissue mass dorsal aspect hallux right.  She states that she is doing very well and denies fever chills nausea vomit muscle aches pains calf pain back pain chest pain shortness of breath.  Objective: Dressed her dressing intact was removed demonstrates mild edema no erythema cellulitis drainage or odor sutures are intact to the medial and dorsal medial aspect of the hallux right.  No drainage on the bandage.  Radiographs taken today demonstrate a resection of the medial eminence of the interphalangeal joint and exostectomy.  Assessment: Well-healing surgical toe x1 week right.  Plan: Redressed today dressed a compressive dressing without her start changing the bandage daily with a small amount of Neosporin she otherwise should keep it dry and clean.

## 2021-02-16 ENCOUNTER — Encounter: Payer: Self-pay | Admitting: Podiatry

## 2021-02-16 ENCOUNTER — Telehealth: Payer: Self-pay | Admitting: Podiatry

## 2021-02-16 NOTE — Telephone Encounter (Signed)
Patient is requesting a "return to work note" for her employee records (employer requires note from provider for patient to return to work). Will it be ok for me to type this up for her?

## 2021-02-16 NOTE — Telephone Encounter (Signed)
Pt would like this letter to be faxed to [employer] at [phone].

## 2021-02-22 ENCOUNTER — Other Ambulatory Visit: Payer: Self-pay

## 2021-02-22 ENCOUNTER — Ambulatory Visit (INDEPENDENT_AMBULATORY_CARE_PROVIDER_SITE_OTHER): Payer: Managed Care, Other (non HMO) | Admitting: Podiatry

## 2021-02-22 ENCOUNTER — Encounter: Payer: Self-pay | Admitting: Podiatry

## 2021-02-22 DIAGNOSIS — M6740 Ganglion, unspecified site: Secondary | ICD-10-CM

## 2021-02-22 DIAGNOSIS — M898X7 Other specified disorders of bone, ankle and foot: Secondary | ICD-10-CM

## 2021-02-22 DIAGNOSIS — Z9889 Other specified postprocedural states: Secondary | ICD-10-CM

## 2021-02-23 DIAGNOSIS — M79676 Pain in unspecified toe(s): Secondary | ICD-10-CM

## 2021-02-26 NOTE — Progress Notes (Addendum)
She presents today for her postop visit she is status post exostectomy hallux right excision mucoid cyst.  She states is feeling okay if a little bit wet right here she points to the 1 end.  Objective: Vital signs stable oriented x3 there is some maceration along the incision site.  Vascular to keep this dry and clean following for another week and then we will remove all of the sutures.  Assessment: Well-healing surgical foot.  Plan: I redressed today follow-up with me in a week.

## 2021-03-01 ENCOUNTER — Other Ambulatory Visit: Payer: Self-pay

## 2021-03-01 ENCOUNTER — Encounter: Payer: Self-pay | Admitting: Podiatry

## 2021-03-01 ENCOUNTER — Ambulatory Visit (INDEPENDENT_AMBULATORY_CARE_PROVIDER_SITE_OTHER): Payer: Managed Care, Other (non HMO) | Admitting: Podiatry

## 2021-03-01 DIAGNOSIS — Z9889 Other specified postprocedural states: Secondary | ICD-10-CM

## 2021-03-01 DIAGNOSIS — M6740 Ganglion, unspecified site: Secondary | ICD-10-CM

## 2021-03-01 DIAGNOSIS — M898X7 Other specified disorders of bone, ankle and foot: Secondary | ICD-10-CM

## 2021-03-01 NOTE — Progress Notes (Signed)
She presents today for postop visit status post exostectomy and excision soft tissue lesion.  States that it seems to be doing a lot better.  Objective: Vital signs are stable alert oriented x3 there is no erythematous mild edema no cellulitis drainage or odor sutures were removed today margins remain well coapted.  Assessment: Well-healing surgical toe.  Plan: I will follow-up with her in 3 weeks just to make sure she is going on to heal quite nicely also need to check pathology for her.

## 2021-03-06 NOTE — Telephone Encounter (Addendum)
Called patient to give pathology results per Dr Al Corpus, no answer, left vmessage for returned call of those results. Returned call to patient and gave results of the pathology report.she verbalized understanding.

## 2021-03-06 NOTE — Telephone Encounter (Signed)
-----   Message from Kristian Covey, [facility] sent at 03/03/2021 12:12 PM EDT ----- Regarding: Pathology results Dr. Al Corpus just wanted this patient called to let her know that the pathology from her surgery come back as a mucoid cyst. The report wasn't ready when she was in for POV#1 and she was asking.

## 2021-03-08 ENCOUNTER — Encounter: Payer: Managed Care, Other (non HMO) | Admitting: Podiatry

## 2021-03-22 ENCOUNTER — Encounter: Payer: Self-pay | Admitting: Podiatry

## 2021-03-22 ENCOUNTER — Other Ambulatory Visit: Payer: Self-pay

## 2021-03-22 ENCOUNTER — Ambulatory Visit (INDEPENDENT_AMBULATORY_CARE_PROVIDER_SITE_OTHER): Payer: Managed Care, Other (non HMO) | Admitting: Podiatry

## 2021-03-22 DIAGNOSIS — Z9889 Other specified postprocedural states: Secondary | ICD-10-CM

## 2021-03-22 DIAGNOSIS — M6740 Ganglion, unspecified site: Secondary | ICD-10-CM

## 2021-03-22 DIAGNOSIS — M898X7 Other specified disorders of bone, ankle and foot: Secondary | ICD-10-CM

## 2021-03-22 NOTE — Progress Notes (Signed)
She presents today for follow-up of her exostectomy and excision mucoid cyst hallux right.  Date of surgery 02/10/2021 states that is still swollen and sore there in the corner where the cyst was taken out.  Objective: Vital signs are stable alert oriented x3 there is no erythema just some mild scarring of the area she is getting a better range of motion of the hallux interphalangeal joint that she had previously.  Assessment: Appears to be healing nicely though it is scarred down to the bone.  Plan: Encouraged range of motion exercises massage therapy and I will follow-up with her on an as-needed basis.

## 2021-03-23 ENCOUNTER — Encounter: Payer: Self-pay | Admitting: Obstetrics and Gynecology

## 2021-03-23 ENCOUNTER — Ambulatory Visit (INDEPENDENT_AMBULATORY_CARE_PROVIDER_SITE_OTHER): Payer: Managed Care, Other (non HMO) | Admitting: Obstetrics and Gynecology

## 2021-03-23 VITALS — BP 130/80 | Ht 63.0 in | Wt 193.0 lb

## 2021-03-23 DIAGNOSIS — Z1231 Encounter for screening mammogram for malignant neoplasm of breast: Secondary | ICD-10-CM | POA: Diagnosis not present

## 2021-03-23 DIAGNOSIS — Z01419 Encounter for gynecological examination (general) (routine) without abnormal findings: Secondary | ICD-10-CM

## 2021-03-23 NOTE — Patient Instructions (Signed)
I value your feedback and you entrusting us with your care. If you get a [facility] patient survey, I would appreciate you taking the time to let us know about your experience today. Thank you!   [facility]: [phone]  

## 2021-03-23 NOTE — Progress Notes (Signed)
Chief Complaint  Patient presents with   Gynecologic Exam    No concerns     HPI:      [name] is a 55 y.o. [id number] who LMP was Patient's last menstrual period was 01/26/2018 (exact date)., presents today for her annual examination.  Her menses are absent due to menopause. Denies PMB. She has tolerable vasomotor sx, improving.    Sex activity: single partner, contraception - vasectomy. She does have vaginal dryness and uses lubricants with relief.   Last Pap: 02/18/18  Results were: no abnormalities /neg HPV DNA.  Hx of STDs: none  Last mammogram: 03/28/20 at [facility].  Results were: normal--routine follow-up in 12 months There is no FH of breast cancer. There is no FH of ovarian cancer. The patient does do self-breast exams.   Colonoscopy: colonoscopy 7/20 with Dr. Marva Panda at [facility]. Repeat due after 5 yrs.   Tobacco use: The patient denies current or previous tobacco use. Alcohol use: none No drug use Exercise: moderately active   She does get adequate calcium and Vitamin D in her diet.   Labs with PCP.    Past Medical History:  Diagnosis Date   Anxiety    GERD (gastroesophageal reflux disease)    GERD (gastroesophageal reflux disease)    Heart murmur    followed by PCP   Hyperlipidemia    Hypertension    Pre-diabetes    Prediabetes     Past Surgical History:  Procedure Laterality Date   CERVICAL DISC SURGERY  05/2016   anterior fusion   CESAREAN SECTION     COLONOSCOPY     DILATION AND CURETTAGE OF UTERUS     ESOPHAGOGASTRODUODENOSCOPY N/A 12/28/2015   Procedure: ESOPHAGOGASTRODUODENOSCOPY (EGD);  Surgeon: Wallace Cullens, MD;  Location: [facility];  Service: Gastroenterology;  Laterality: N/A;   TENOTOMY Right    arm    History reviewed. No pertinent family history.  Social History   Socioeconomic History   Marital status: Married    Spouse name: Not on file   Number of children: 3   Years of education: Not on file   Highest education  level: Not on file  Occupational History   Not on file  Tobacco Use   Smoking status: Never   Smokeless tobacco: Never  Vaping Use   Vaping Use: Never used  Substance and Sexual Activity   Alcohol use: No   Drug use: No   Sexual activity: Yes    Birth control/protection: Post-menopausal  Other Topics Concern   Not on file  Social History Narrative   Not on file   Social Determinants of Health   Financial Resource Strain: Not on file  Food Insecurity: Not on file  Transportation Needs: Not on file  Physical Activity: Not on file  Stress: Not on file  Social Connections: Not on file  Intimate Partner Violence: Not on file    Current Outpatient Medications on File Prior to Visit  Medication Sig Dispense Refill   Cholecalciferol (VITAMIN D3) 1000 units CAPS 2 (two) times daily.     gentamicin ointment (GARAMYCIN) 0.1 % Apply topically 3 (three) times daily.     hydrochlorothiazide (HYDRODIURIL) 25 MG tablet 25 mg.     losartan (COZAAR) 50 MG tablet Take 50 mg by mouth daily.     meloxicam (MOBIC) 7.5 MG tablet meloxicam 7.5 mg tablet  TAKE 1 TABLET BY MOUTH TWICE DAILY     metFORMIN (GLUCOPHAGE-XR) 500 MG 24  hr tablet Take 500 mg by mouth 2 (two) times daily.     mometasone (NASONEX) 50 MCG/ACT nasal spray NASONEX 50 MCG/ACT SUSP     Multiple Vitamins-Minerals (SUPER THERA VITE M PO) Take by mouth daily.     omeprazole (PRILOSEC) 40 MG capsule omeprazole 20 mg tablet,delayed release  Take by oral route.     ondansetron (ZOFRAN) 4 MG tablet Take 1 tablet (4 mg total) by mouth every 8 (eight) hours as needed. 20 tablet 0   pravastatin (PRAVACHOL) 40 MG tablet Take 40 mg by mouth daily.     RYBELSUS 7 MG TABS Take 1 tablet by mouth daily.     sucralfate (CARAFATE) 1 g tablet Take by mouth.     No current facility-administered medications on file prior to visit.    ROS:  Review of Systems  Constitutional:  Negative for fatigue, fever and unexpected weight change.   Respiratory:  Negative for cough, shortness of breath and wheezing.   Cardiovascular:  Negative for chest pain, palpitations and leg swelling.  Gastrointestinal:  Negative for blood in stool, constipation, diarrhea, nausea and vomiting.  Endocrine: Negative for cold intolerance, heat intolerance and polyuria.  Genitourinary:  Negative for dyspareunia, dysuria, flank pain, frequency, genital sores, hematuria, menstrual problem, pelvic pain, urgency, vaginal bleeding, vaginal discharge and vaginal pain.  Musculoskeletal:  Negative for back pain, joint swelling and myalgias.  Skin:  Negative for rash.  Neurological:  Negative for dizziness, syncope, light-headedness, numbness and headaches.  Hematological:  Negative for adenopathy.  Psychiatric/Behavioral:  Negative for agitation, confusion, sleep disturbance and suicidal ideas. The patient is not nervous/anxious.     Objective: BP 130/80   Ht 5\' 3"  (1.6 m)   Wt 193 lb (87.5 kg)   LMP 01/26/2018 (Exact Date)   BMI 34.19 kg/m    Physical Exam Constitutional:      Appearance: She is well-developed.  Genitourinary:     Vulva normal.     Right Labia: No rash, tenderness or lesions.    Left Labia: No tenderness, lesions or rash.    No vaginal discharge, erythema or tenderness.      Right Adnexa: not tender and no mass present.    Left Adnexa: not tender and no mass present.    No cervical motion tenderness, friability or polyp.     Uterus is not enlarged or tender.  Breasts:    Right: No mass, nipple discharge, skin change or tenderness.     Left: No mass, nipple discharge, skin change or tenderness.  Neck:     Thyroid: No thyromegaly.  Cardiovascular:     Rate and Rhythm: Normal rate and regular rhythm.     Heart sounds: Normal heart sounds. No murmur heard. Pulmonary:     Effort: Pulmonary effort is normal.     Breath sounds: Normal breath sounds.  Abdominal:     Palpations: Abdomen is soft.     Tenderness: There is no  abdominal tenderness. There is no guarding or rebound.  Musculoskeletal:        General: Normal range of motion.     Cervical back: Normal range of motion.  Lymphadenopathy:     Cervical: No cervical adenopathy.  Neurological:     General: No focal deficit present.     Mental Status: She is alert and oriented to person, place, and time.     Cranial Nerves: No cranial nerve deficit.  Skin:    General: Skin is warm and dry.  Psychiatric:  Mood and Affect: Mood normal.        Behavior: Behavior normal.        Thought Content: Thought content normal.        Judgment: Judgment normal.  Vitals reviewed.    Assessment/Plan: Encounter for annual routine gynecological examination  Encounter for screening mammogram for malignant neoplasm of breast - Plan: MM 3D SCREEN BREAST BILATERAL; pt to sched mammo      GYN counsel breast self exam, mammography screening, menopause, adequate intake of calcium and vitamin D, diet and exercise     F/U  Return in about 1 year (around 03/23/2022).  Brittany Amirault B. Chosen Geske, PA-C 03/23/2021 4:14 PM

## 2021-10-11 DIAGNOSIS — G720 Drug-induced myopathy: Secondary | ICD-10-CM | POA: Insufficient documentation

## 2021-12-03 IMAGING — NM NM HEPATO W/GB/PHARM/[PERSON_NAME]
2 series · 12 of 12 positions shown · non-contrast
Comparison: None.

CLINICAL DATA: Upper abdominal pain

EXAM:
NUCLEAR MEDICINE HEPATOBILIARY IMAGING WITH GALLBLADDER EF
VIEWS:
Anterior right upper quadrant
RADIOPHARMACEUTICALS:  5.58 mCi Lc-OOm mebrofenin IV

[Series 1000: hepatobiliary scan · 9.59mm/px · 6 of 60 frames shown]
[frame 6/60]
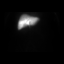
[frame 16/60]
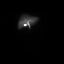
[frame 26/60]
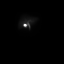
[frame 36/60]
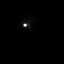
[frame 46/60]
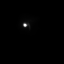
[frame 56/60]
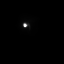

[Series 1000: gallbladder ef · 4.80mm/px · 6 of 120 frames shown]
[frame 11/120]
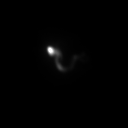
[frame 31/120]
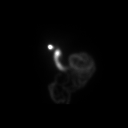
[frame 51/120]
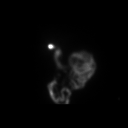
[frame 71/120]
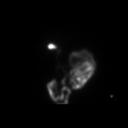
[frame 91/120]
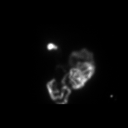
[frame 111/120]
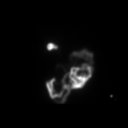

[12 of 12 positions shown; findings below may reference images not displayed]

FINDINGS: Liver uptake of radiotracer is unremarkable. There is prompt
visualization of gallbladder and small bowel, consistent with
patency of the cystic and common bile ducts. The patient received a
weight based dose, 1.8 mcg, of CCK with calculation of the computer
generated ejection fraction of radiotracer from the gallbladder. The
patient experienced mild abdominal pain with the CCK administration.
The computer generated ejection fraction of radiotracer from the
gallbladder is normal at 85%, normal greater than 40%.
IMPRESSION: Normal ejection fraction of radiotracer from the gallbladder. The
patient experienced mild abdominal pain with the CCK administration.
Cystic and common bile ducts are patent as is evidenced by
visualization of gallbladder and small bowel.

## 2022-05-15 ENCOUNTER — Other Ambulatory Visit (HOSPITAL_COMMUNITY)
Admission: RE | Admit: 2022-05-15 | Discharge: 2022-05-15 | Disposition: A | Discharge status: Home / Self Care | Payer: Managed Care, Other (non HMO) | Source: Ambulatory Visit | Attending: Obstetrics and Gynecology | Admitting: Obstetrics and Gynecology

## 2022-05-15 ENCOUNTER — Ambulatory Visit (INDEPENDENT_AMBULATORY_CARE_PROVIDER_SITE_OTHER): Payer: Managed Care, Other (non HMO) | Admitting: Obstetrics and Gynecology

## 2022-05-15 ENCOUNTER — Encounter: Payer: Self-pay | Admitting: Obstetrics and Gynecology

## 2022-05-15 VITALS — BP 130/80 | Ht 63.0 in | Wt 187.0 lb

## 2022-05-15 DIAGNOSIS — Z124 Encounter for screening for malignant neoplasm of cervix: Secondary | ICD-10-CM | POA: Diagnosis present

## 2022-05-15 DIAGNOSIS — Z1151 Encounter for screening for human papillomavirus (HPV): Secondary | ICD-10-CM | POA: Insufficient documentation

## 2022-05-15 DIAGNOSIS — Z1231 Encounter for screening mammogram for malignant neoplasm of breast: Secondary | ICD-10-CM

## 2022-05-15 DIAGNOSIS — Z01419 Encounter for gynecological examination (general) (routine) without abnormal findings: Secondary | ICD-10-CM | POA: Diagnosis not present

## 2022-05-15 DIAGNOSIS — N951 Menopausal and female climacteric states: Secondary | ICD-10-CM

## 2022-05-15 NOTE — Patient Instructions (Signed)
I value your feedback and you entrusting us with your care. If you get a [place] patient survey, I would appreciate you taking the time to let us know about your experience today. Thank you!   [facility]: [phone]  

## 2022-05-15 NOTE — Progress Notes (Signed)
Chief Complaint  Patient presents with   Gynecologic Exam    No concerns     HPI:      [name] is a 56 y.o. [id number] who LMP was Patient's last menstrual period was 01/26/2018 (exact date)., presents today for her annual examination.  Her menses are absent due to menopause. Denies PMB. She has tolerable vasomotor sx, improving.    Sex activity: single partner, contraception - vasectomy. She does have vaginal dryness and uses lubricants with some relief. Hasn't tried coconut oil.    Last Pap: 02/18/18  Results were: no abnormalities /neg HPV DNA.  Hx of STDs: none  Last mammogram: 03/30/21 at [facility].  Results were: normal--routine follow-up in 12 months There is no FH of breast cancer. There is no FH of ovarian cancer. The patient does self-breast exams.   Colonoscopy: colonoscopy 7/20 with Dr. Marva Panda at [facility]. Repeat due after 5 yrs.   Tobacco use: The patient denies current or previous tobacco use. Alcohol use: none No drug use Exercise: moderately active   She does get adequate calcium and Vitamin D in her diet.   Labs with PCP.    Past Medical History:  Diagnosis Date   Anxiety    GERD (gastroesophageal reflux disease)    GERD (gastroesophageal reflux disease)    Heart murmur    followed by PCP   Hyperlipidemia    Hypertension    Pre-diabetes    Prediabetes     Past Surgical History:  Procedure Laterality Date   CERVICAL DISC SURGERY  05/2016   anterior fusion   CESAREAN SECTION     COLONOSCOPY     DILATION AND CURETTAGE OF UTERUS     ESOPHAGOGASTRODUODENOSCOPY N/A 12/28/2015   Procedure: ESOPHAGOGASTRODUODENOSCOPY (EGD);  Surgeon: Wallace Cullens, MD;  Location: [facility];  Service: Gastroenterology;  Laterality: N/A;   TENOTOMY Right    arm    History reviewed. No pertinent family history.  Social History   Socioeconomic History   Marital status: Married    Spouse name: Not on file   Number of children: 3   Years of education: Not  on file   Highest education level: Not on file  Occupational History   Not on file  Tobacco Use   Smoking status: Never   Smokeless tobacco: Never  Vaping Use   Vaping Use: Never used  Substance and Sexual Activity   Alcohol use: No   Drug use: No   Sexual activity: Yes    Birth control/protection: Post-menopausal  Other Topics Concern   Not on file  Social History Narrative   Not on file   Social Determinants of Health   Financial Resource Strain: Not on file  Food Insecurity: Not on file  Transportation Needs: Not on file  Physical Activity: Not on file  Stress: Not on file  Social Connections: Not on file  Intimate Partner Violence: Not on file    Current Outpatient Medications on File Prior to Visit  Medication Sig Dispense Refill   Cholecalciferol (VITAMIN D3) 1000 units CAPS 2 (two) times daily.     gentamicin ointment (GARAMYCIN) 0.1 % Apply topically 3 (three) times daily.     hydrochlorothiazide (HYDRODIURIL) 25 MG tablet 25 mg.     losartan (COZAAR) 50 MG tablet Take 50 mg by mouth daily.     metFORMIN (GLUCOPHAGE-XR) 500 MG 24 hr tablet Take 500 mg by mouth 2 (two) times daily.     mometasone (  NASONEX) 50 MCG/ACT nasal spray NASONEX 50 MCG/ACT SUSP     Multiple Vitamins-Minerals (SUPER THERA VITE M PO) Take by mouth daily.     omeprazole (PRILOSEC) 40 MG capsule omeprazole 20 mg tablet,delayed release  Take by oral route.     pravastatin (PRAVACHOL) 40 MG tablet Take 40 mg by mouth daily.     RYBELSUS 14 MG TABS Take 1 tablet by mouth daily.     sucralfate (CARAFATE) 1 g tablet Take by mouth.     TRUE METRIX BLOOD GLUCOSE TEST test strip daily.     No current facility-administered medications on file prior to visit.    ROS:  Review of Systems  Constitutional:  Negative for fatigue, fever and unexpected weight change.  Respiratory:  Negative for cough, shortness of breath and wheezing.   Cardiovascular:  Negative for chest pain, palpitations and leg  swelling.  Gastrointestinal:  Negative for blood in stool, constipation, diarrhea, nausea and vomiting.  Endocrine: Negative for cold intolerance, heat intolerance and polyuria.  Genitourinary:  Negative for dyspareunia, dysuria, flank pain, frequency, genital sores, hematuria, menstrual problem, pelvic pain, urgency, vaginal bleeding, vaginal discharge and vaginal pain.  Musculoskeletal:  Negative for back pain, joint swelling and myalgias.  Skin:  Negative for rash.  Neurological:  Negative for dizziness, syncope, light-headedness, numbness and headaches.  Hematological:  Negative for adenopathy.  Psychiatric/Behavioral:  Negative for agitation, confusion, sleep disturbance and suicidal ideas. The patient is not nervous/anxious.      Objective: BP 130/80   Ht 5\' 3"  (1.6 m)   Wt 187 lb (84.8 kg)   LMP 01/26/2018 (Exact Date)   BMI 33.13 kg/m    Physical Exam Constitutional:      Appearance: She is well-developed.  Genitourinary:     Vulva normal.     Right Labia: No rash, tenderness or lesions.    Left Labia: No tenderness, lesions or rash.    No vaginal discharge, erythema or tenderness.      Right Adnexa: not tender and no mass present.    Left Adnexa: not tender and no mass present.    No cervical motion tenderness, friability or polyp.     Uterus is not enlarged or tender.  Breasts:    Right: No mass, nipple discharge, skin change or tenderness.     Left: No mass, nipple discharge, skin change or tenderness.  Neck:     Thyroid: No thyromegaly.  Cardiovascular:     Rate and Rhythm: Normal rate and regular rhythm.     Heart sounds: Normal heart sounds. No murmur heard. Pulmonary:     Effort: Pulmonary effort is normal.     Breath sounds: Normal breath sounds.  Abdominal:     Palpations: Abdomen is soft.     Tenderness: There is no abdominal tenderness. There is no guarding or rebound.  Musculoskeletal:        General: Normal range of motion.     Cervical back:  Normal range of motion.  Lymphadenopathy:     Cervical: No cervical adenopathy.  Neurological:     General: No focal deficit present.     Mental Status: She is alert and oriented to person, place, and time.     Cranial Nerves: No cranial nerve deficit.  Skin:    General: Skin is warm and dry.  Psychiatric:        Mood and Affect: Mood normal.        Behavior: Behavior normal.  Thought Content: Thought content normal.        Judgment: Judgment normal.  Vitals reviewed.     Assessment/Plan: Encounter for annual routine gynecological examination  Cervical cancer screening - Plan: Cytology - PAP  Screening for HPV (human papillomavirus) - Plan: Cytology - PAP  Encounter for screening mammogram for malignant neoplasm of breast - Plan: MM 3D SCREEN BREAST BILATERAL; pt to schedule mammo  Vaginal dryness, menopausal--try coconut oil. If sx persist, can try vag ERT. F/u prn.   GYN counsel breast self exam, mammography screening, menopause, adequate intake of calcium and vitamin D, diet and exercise     F/U  Return in about 1 year (around 05/16/2023).  Shrey Boike B. Jyra Lagares, PA-C 05/15/2022 3:18 PM

## 2022-05-17 LAB — CYTOLOGY - PAP
Comment: NEGATIVE
Diagnosis: NEGATIVE
High risk HPV: NEGATIVE

## 2022-05-22 ENCOUNTER — Encounter: Payer: Self-pay | Admitting: Obstetrics and Gynecology

## 2023-03-10 ENCOUNTER — Ambulatory Visit
Admission: EM | Admit: 2023-03-10 | Discharge: 2023-03-10 | Disposition: A | Discharge status: Home / Self Care | Payer: Managed Care, Other (non HMO)

## 2023-03-10 DIAGNOSIS — J329 Chronic sinusitis, unspecified: Secondary | ICD-10-CM | POA: Diagnosis not present

## 2023-03-10 MED ORDER — PREDNISONE 50 MG PO TABS: 50.0000 mg | ORAL_TABLET | Freq: Every day | ORAL | 0 refills | Status: AC

## 2023-03-10 MED ORDER — AMOXICILLIN-POT CLAVULANATE ER 1000-62.5 MG PO TB12
2.0000 | ORAL_TABLET | Freq: Two times a day (BID) | ORAL | 0 refills | Status: AC
Start: 2023-03-10 — End: 2023-03-20

## 2023-03-10 NOTE — ED Triage Notes (Addendum)
Patient states she had been dealing with sinus and ear infections since around April 6th (was treated with abx). The patient states she is still having pain to her right ear, and pressure to right side of face.

## 2023-03-10 NOTE — ED Provider Notes (Signed)
Alexandra Stone    CSN: [id number] Arrival date & time: 03/10/23  2130      History   Chief Complaint Chief Complaint  Patient presents with   Ear Pain    HPI Alexandra Stone is a 57 y.o. female.   HPI  Patient presents to urgent care with concern for long-term sinus and ear infection.  She states she has been fighting infection longer than 6 weeks (since April 6) when she was treated with antibiotics.  Patient chart indicates visit at [facility] on 01/19/2023 where she was treated for acute right otitis media with Augmentin.  She was also given a course of prednisone (20 mg x 5 days).  She states she was seen at ENT at the end of April where she was again treated with antibiotic (cefdinir?) and 6 days steroid taper (not reflected in chart).  Presents to day with R sided facial pain and R otalgia.  Past Medical History:  Diagnosis Date   Anxiety    GERD (gastroesophageal reflux disease)    GERD (gastroesophageal reflux disease)    Heart murmur    followed by PCP   Hyperlipidemia    Hypertension    Pre-diabetes    Prediabetes     Patient Active Problem List   Diagnosis Date Noted   Controlled type 2 diabetes mellitus with complication, without long-term current use of insulin (HCC) 12/12/2020   Healthcare maintenance 12/12/2020   Atrophic vaginitis 12/27/2019   GERD (gastroesophageal reflux disease)    Hyperlipidemia    Hypertension    Prediabetes    Menopause 02/18/2018   Allergic rhinitis 10/13/2015   Cardiac murmur 06/01/2008    Past Surgical History:  Procedure Laterality Date   CERVICAL DISC SURGERY  05/2016   anterior fusion   CESAREAN SECTION     COLONOSCOPY     DILATION AND CURETTAGE OF UTERUS     ESOPHAGOGASTRODUODENOSCOPY N/A 12/28/2015   Procedure: ESOPHAGOGASTRODUODENOSCOPY (EGD);  Surgeon: Wallace Cullens, MD;  Location: [facility];  Service: Gastroenterology;  Laterality: N/A;   TENOTOMY Right    arm    OB History      Gravida  4   Para  3   Term  2   Preterm  1   AB  1   Living  3      SAB  1   IAB      Ectopic      Multiple      Live Births  3            Home Medications    Prior to Admission medications   Medication Sig Start Date End Date Taking? Authorizing Provider  ALLERGY RELIEF 180 MG tablet Take 180 mg by mouth daily. 03/06/23  Yes [provider]  Cholecalciferol (VITAMIN D3) 1000 units CAPS 2 (two) times daily. 01/27/13   [provider]  gentamicin ointment (GARAMYCIN) 0.1 % Apply topically 3 (three) times daily. 08/10/20   [provider]  hydrochlorothiazide (HYDRODIURIL) 25 MG tablet 25 mg. 09/15/10   [provider]  losartan (COZAAR) 50 MG tablet Take 50 mg by mouth daily. 02/18/20   [provider]  metFORMIN (GLUCOPHAGE-XR) 500 MG 24 hr tablet Take 500 mg by mouth 2 (two) times daily. 11/27/19   [provider]  mometasone (NASONEX) 50 MCG/ACT nasal spray NASONEX 50 MCG/ACT SUSP 11/08/15   [provider]  Multiple Vitamins-Minerals (SUPER THERA VITE M PO) Take by mouth daily.  [provider]  omeprazole (PRILOSEC) 40 MG capsule omeprazole 20 mg tablet,delayed release  Take by oral route.    [provider]  pravastatin (PRAVACHOL) 40 MG tablet Take 40 mg by mouth daily.    [provider]  rosuvastatin (CRESTOR) 20 MG tablet Take 20 mg by mouth at bedtime.    [provider]  RYBELSUS 14 MG TABS Take 1 tablet by mouth daily. 05/03/22   [provider]  sucralfate (CARAFATE) 1 g tablet Take by mouth. 02/13/18   [provider]  TRUE METRIX BLOOD GLUCOSE TEST test strip daily. 04/20/22   [provider]    Family History History reviewed. No pertinent family history.  Social History Social History   Tobacco Use   Smoking status: Never   Smokeless tobacco: Never  Vaping Use   Vaping Use: Never used  Substance Use Topics   Alcohol use:  No   Drug use: No     Allergies   Other, Ezetimibe, Lisinopril, Sulfa antibiotics, and Latex   Review of Systems Review of Systems   Physical Exam Triage Vital Signs ED Triage Vitals  Enc Vitals Group     BP 03/10/23 1030 (!) 144/87     Pulse Rate 03/10/23 1030 69     Resp 03/10/23 1030 18     Temp 03/10/23 1030 99 F (37.2 C)     Temp Source 03/10/23 1030 Oral     SpO2 03/10/23 1030 95 %     Weight --      Height --      Head Circumference --      Peak Flow --      Pain Score 03/10/23 1027 5     Pain Loc --      Pain Edu? --      Excl. in GC? --    No data found.  Updated Vital Signs BP (!) 144/87 (BP Location: Left Arm)   Pulse 69   Temp 99 F (37.2 C) (Oral)   Resp 18   LMP 01/26/2018 (Exact Date)   SpO2 95%   Visual Acuity Right Eye Distance:   Left Eye Distance:   Bilateral Distance:    Right Eye Near:   Left Eye Near:    Bilateral Near:     Physical Exam Vitals reviewed.  Constitutional:      Appearance: Normal appearance.  HENT:     Right Ear: Tympanic membrane normal.     Left Ear: Tympanic membrane normal.     Nose:     Right Sinus: Maxillary sinus tenderness present.  Cardiovascular:     Rate and Rhythm: Normal rate and regular rhythm.     Pulses: Normal pulses.     Heart sounds: Normal heart sounds.  Pulmonary:     Effort: Pulmonary effort is normal.     Breath sounds: Normal breath sounds.  Skin:    General: Skin is warm and dry.  Neurological:     General: No focal deficit present.     Mental Status: She is alert and oriented to person, place, and time.  Psychiatric:        Mood and Affect: Mood normal.        Behavior: Behavior normal.      UC Treatments / Results  Labs (all labs ordered are listed, but only abnormal results are displayed) Labs Reviewed - No data to display  EKG   Radiology No results found.  Procedures Procedures (including critical care  time)  Medications Ordered in UC Medications - No  data to display  Initial Impression / Assessment and Plan / UC Course  I have reviewed the triage vital signs and the nursing notes.  Pertinent labs & imaging results that were available during my care of the patient were reviewed by me and considered in my medical decision making (see chart for details).   Alexandra Stone is a 57 y.o. female presenting with sinusitis and R otalgia. Patient is afebrile without recent antipyretics, satting well on room air. Overall is well appearing, well hydrated, without respiratory distress. Pulmonary exam is unremarkable.  Lungs CTAB without wheezing, rhonchi, rales. RRR.  There is right-sided maxillary tenderness with palpation.  TMs are WNL bilaterally.  Reviewed relevant chart history.   Presumed recurrent sinusitis following treatment with Augmentin 875 and (presumed) cefdinir as well as 2 courses of prednisone.  Per guidelines, will treat today with Augmentin 2000.  I will add another course of prednisone at a higher dose and she will seek follow-up with her ENT.  I suggested that their next intervention/evaluation might include sinus CT.  Counseled patient on potential for adverse effects with medications prescribed/recommended today, ER and return-to-clinic precautions discussed, patient verbalized understanding and agreement with care plan.  Final Clinical Impressions(s) / UC Diagnoses   Final diagnoses:  None   Discharge Instructions   None    ED Prescriptions   None    PDMP not reviewed this encounter.   Charma Igo, [place] 03/10/23 1117

## 2023-03-10 NOTE — Discharge Instructions (Signed)
Please follow-up with evaluation by ENT.

## 2023-05-27 ENCOUNTER — Encounter: Payer: Self-pay | Admitting: Obstetrics and Gynecology

## 2023-06-13 NOTE — Progress Notes (Signed)
Chief Complaint  Patient presents with   Gynecologic Exam    No concerns     HPI:      [name] is a 57 y.o. [id number] who LMP was Patient's last menstrual period was 01/26/2018 (exact date)., presents today for her annual examination.  Her menses are absent due to menopause. Denies PMB. No vasomotor sx.    Sex activity: single partner, contraception - vasectomy. She does have vaginal dryness and uses lubricants/coconut with relief.    Last Pap: 05/15/22 Results were: no abnormalities /neg HPV DNA.  Hx of STDs: none  Last mammogram: 05/20/23 at [facility].  Results were: normal--routine follow-up in 12 months There is no FH of breast cancer. There is no FH of ovarian cancer. The patient does self-breast exams.   Colonoscopy: colonoscopy 7/20 with Dr. Marva Panda at [facility]. Repeat due after 5 yrs.   Tobacco use: The patient denies current or previous tobacco use. Alcohol use: none No drug use Exercise: moderately active   She does get adequate calcium and Vitamin D in her diet.   Labs with PCP.    Past Medical History:  Diagnosis Date   Anxiety    GERD (gastroesophageal reflux disease)    GERD (gastroesophageal reflux disease)    Heart murmur    followed by PCP   Hyperlipidemia    Hypertension    Pre-diabetes    Prediabetes     Past Surgical History:  Procedure Laterality Date   CERVICAL DISC SURGERY  05/2016   anterior fusion   CESAREAN SECTION     COLONOSCOPY     DILATION AND CURETTAGE OF UTERUS     ESOPHAGOGASTRODUODENOSCOPY N/A 12/28/2015   Procedure: ESOPHAGOGASTRODUODENOSCOPY (EGD);  Surgeon: Wallace Cullens, MD;  Location: [facility];  Service: Gastroenterology;  Laterality: N/A;   TENOTOMY Right    arm    History reviewed. No pertinent family history.  Social History   Socioeconomic History   Marital status: Married    Spouse name: Not on file   Number of children: 3   Years of education: Not on file   Highest education level: Not on file   Occupational History   Not on file  Tobacco Use   Smoking status: Never   Smokeless tobacco: Never  Vaping Use   Vaping status: Never Used  Substance and Sexual Activity   Alcohol use: No   Drug use: No   Sexual activity: Yes    Birth control/protection: Post-menopausal  Other Topics Concern   Not on file  Social History Narrative   Not on file   Social Determinants of Health   Financial Resource Strain: Low Risk  (05/08/2023)   Received from [facility]   Overall Financial Resource Strain (CARDIA)    Difficulty of Paying Living Expenses: Not hard at all  Food Insecurity: No Food Insecurity (05/08/2023)   Received from [facility]   Hunger Vital Sign    Worried About Running Out of Food in the Last Year: Never true    Ran Out of Food in the Last Year: Never true  Transportation Needs: No Transportation Needs (05/08/2023)   Received from [facility]    In the past 12 months, has lack of transportation kept you from medical appointments or from getting medications?: No    Lack of Transportation (Non-Medical): No  Physical Activity: Not on file  Stress: Not on file  Social  Connections: Not on file  Intimate Partner Violence: Not on file    Current Outpatient Medications on File Prior to Visit  Medication Sig Dispense Refill   ALLERGY RELIEF 180 MG tablet Take 180 mg by mouth daily.     Cholecalciferol (VITAMIN D3) 1000 units CAPS 2 (two) times daily.     EPINEPHrine 0.3 mg/0.3 mL IJ SOAJ injection Inject into the muscle once as needed.     gentamicin ointment (GARAMYCIN) 0.1 % Apply topically 3 (three) times daily.     hydrochlorothiazide (HYDRODIURIL) 25 MG tablet 25 mg.     losartan (COZAAR) 50 MG tablet Take 50 mg by mouth daily.     metFORMIN (GLUCOPHAGE-XR) 500 MG 24 hr tablet Take by mouth.     Multiple Vitamins-Minerals (SUPER THERA VITE M PO) Take by mouth daily.     omeprazole  (PRILOSEC) 40 MG capsule omeprazole 20 mg tablet,delayed release  Take by oral route.     rosuvastatin (CRESTOR) 20 MG tablet Take 20 mg by mouth at bedtime.     Semaglutide 14 MG TABS Take by mouth.     sucralfate (CARAFATE) 1 g tablet Take by mouth.     triamcinolone (NASACORT ALLERGY 24HR) 55 MCG/ACT AERO nasal inhaler      TRUE METRIX BLOOD GLUCOSE TEST test strip daily.     No current facility-administered medications on file prior to visit.    ROS:  Review of Systems  Constitutional:  Negative for fatigue, fever and unexpected weight change.  Respiratory:  Negative for cough, shortness of breath and wheezing.   Cardiovascular:  Negative for chest pain, palpitations and leg swelling.  Gastrointestinal:  Negative for blood in stool, constipation, diarrhea, nausea and vomiting.  Endocrine: Negative for cold intolerance, heat intolerance and polyuria.  Genitourinary:  Negative for dyspareunia, dysuria, flank pain, frequency, genital sores, hematuria, menstrual problem, pelvic pain, urgency, vaginal bleeding, vaginal discharge and vaginal pain.  Musculoskeletal:  Negative for back pain, joint swelling and myalgias.  Skin:  Negative for rash.  Neurological:  Negative for dizziness, syncope, light-headedness, numbness and headaches.  Hematological:  Negative for adenopathy.  Psychiatric/Behavioral:  Negative for agitation, confusion, sleep disturbance and suicidal ideas. The patient is not nervous/anxious.      Objective: BP 138/74   Pulse 82   Ht 5\' 3"  (1.6 m)   Wt 172 lb (78 kg)   LMP 01/26/2018 (Exact Date)   BMI 30.47 kg/m    Physical Exam Constitutional:      Appearance: She is well-developed.  Genitourinary:     Vulva normal.     Right Labia: No rash, tenderness or lesions.    Left Labia: No tenderness, lesions or rash.    No vaginal discharge, erythema or tenderness.      Right Adnexa: not tender and no mass present.    Left Adnexa: not tender and no mass  present.    No cervical motion tenderness, friability or polyp.     Uterus is not enlarged or tender.  Breasts:    Right: No mass, nipple discharge, skin change or tenderness.     Left: No mass, nipple discharge, skin change or tenderness.  Neck:     Thyroid: No thyromegaly.  Cardiovascular:     Rate and Rhythm: Normal rate and regular rhythm.     Heart sounds: Normal heart sounds. No murmur heard. Pulmonary:     Effort: Pulmonary effort is normal.     Breath sounds: Normal breath sounds.  Abdominal:  Palpations: Abdomen is soft.     Tenderness: There is no abdominal tenderness. There is no guarding or rebound.  Musculoskeletal:        General: Normal range of motion.     Cervical back: Normal range of motion.  Lymphadenopathy:     Cervical: No cervical adenopathy.  Neurological:     General: No focal deficit present.     Mental Status: She is alert and oriented to person, place, and time.     Cranial Nerves: No cranial nerve deficit.  Skin:    General: Skin is warm and dry.  Psychiatric:        Mood and Affect: Mood normal.        Behavior: Behavior normal.        Thought Content: Thought content normal.        Judgment: Judgment normal.  Vitals reviewed.     Assessment/Plan: Encounter for annual routine gynecological examination  Encounter for screening mammogram for malignant neoplasm of breast; pt current on mammo  Vaginal dryness, menopausal--improved with coconut oil. F/u prn for vag ERT.   GYN counsel breast self exam, mammography screening, menopause, adequate intake of calcium and vitamin D, diet and exercise     F/U  Return in about 1 year (around 06/13/2024).  Jeanise Durfey B. Atisha Hamidi, PA-C 06/14/2023 3:08 PM

## 2023-06-14 ENCOUNTER — Encounter: Payer: Self-pay | Admitting: Obstetrics and Gynecology

## 2023-06-14 ENCOUNTER — Ambulatory Visit: Payer: Managed Care, Other (non HMO) | Admitting: Obstetrics and Gynecology

## 2023-06-14 VITALS — BP 138/74 | HR 82 | Ht 63.0 in | Wt 172.0 lb

## 2023-06-14 DIAGNOSIS — Z01419 Encounter for gynecological examination (general) (routine) without abnormal findings: Secondary | ICD-10-CM | POA: Diagnosis not present

## 2023-06-14 DIAGNOSIS — Z1231 Encounter for screening mammogram for malignant neoplasm of breast: Secondary | ICD-10-CM

## 2023-06-14 DIAGNOSIS — N951 Menopausal and female climacteric states: Secondary | ICD-10-CM

## 2023-06-14 NOTE — Patient Instructions (Signed)
I value your feedback and you entrusting us with your care. If you get a [facility] [place] patient survey, I would appreciate you taking the time to let us know about your experience today. Thank you! ? ? ?

## 2023-11-08 DIAGNOSIS — T781XXA Other adverse food reactions, not elsewhere classified, initial encounter: Secondary | ICD-10-CM | POA: Insufficient documentation

## 2023-12-08 ENCOUNTER — Emergency Department
Admission: EM | Admit: 2023-12-08 | Discharge: 2023-12-08 | Disposition: A | Discharge status: Home / Self Care | Payer: Managed Care, Other (non HMO) | Attending: Emergency Medicine | Admitting: Emergency Medicine

## 2023-12-08 ENCOUNTER — Emergency Department: Payer: Managed Care, Other (non HMO)

## 2023-12-08 ENCOUNTER — Other Ambulatory Visit: Payer: Self-pay

## 2023-12-08 DIAGNOSIS — E119 Type 2 diabetes mellitus without complications: Secondary | ICD-10-CM | POA: Diagnosis not present

## 2023-12-08 DIAGNOSIS — I10 Essential (primary) hypertension: Secondary | ICD-10-CM | POA: Insufficient documentation

## 2023-12-08 DIAGNOSIS — M79604 Pain in right leg: Secondary | ICD-10-CM

## 2023-12-08 DIAGNOSIS — M7989 Other specified soft tissue disorders: Secondary | ICD-10-CM | POA: Diagnosis not present

## 2023-12-08 DIAGNOSIS — M25551 Pain in right hip: Secondary | ICD-10-CM | POA: Insufficient documentation

## 2023-12-08 HISTORY — DX: Type 2 diabetes mellitus without complications: E11.9

## 2023-12-08 MED ORDER — PREDNISONE 50 MG PO TABS: 50.0000 mg | ORAL_TABLET | Freq: Every day | ORAL | 0 refills | Status: DC

## 2023-12-08 NOTE — ED Notes (Signed)
 The pt is warm, pink, and dry. The pt is alert and oriented x 4. The pt advised she had an acute onset of right sided groin pain and outer upper thigh pain since Friday. The pt advised she is having pain in her leg but wants to hold off on pain medications.

## 2023-12-08 NOTE — ED Triage Notes (Signed)
 Pt to ED for R leg pain from groin to toes along medial part of leg, aching, since about 2 days ago and worse since last night. Brought from [facility] for DVT rule out. Denies SOB or CP. Does not take thinners. No hx of blood clots.

## 2023-12-08 NOTE — Discharge Instructions (Signed)
 You were evaluated in the ED for right leg pain.  Your ultrasound is negative of DVT.  Please take up to 800 mg of ibuprofen as needed for pain every 6 hours.  Get plenty of rest and keep leg elevated.  Alternate ice application to the affected area and a warm heating pad.  Follow-up with your primary care.  If symptoms worsen, please return to the ED for further assessment.

## 2023-12-08 NOTE — ED Provider Notes (Signed)
 [facility] Emergency Department Provider Note     Event Date/Time   First MD Initiated Contact with Patient 12/08/23 1502     (approximate)   History   Leg Pain   HPI  Alexandra Stone is a 58 y.o. female with a history of diabetes, HTN and HLD presents to the ED for evaluation of right leg pain described as aching and dull x 2 days.  Patient reports pain begins at her groin area and radiates down the medial aspect to her toe. She also notes swelling to her right hip compared to her left. She is sent over by [facility] for evaluation of a DVT.  Patient has no history of such.  She denies recent travel or recent surgery.  Patient reports the pain is worse with standing for long periods of time.  No injury or trauma. No other complaints.     Physical Exam   Triage Vital Signs: ED Triage Vitals  Encounter Vitals Group     BP 12/08/23 1347 (!) 149/90     Systolic BP Percentile --      Diastolic BP Percentile --      Pulse Rate 12/08/23 1347 92     Resp 12/08/23 1347 20     Temp 12/08/23 1347 98.2 F (36.8 C)     Temp Source 12/08/23 1347 Oral     SpO2 12/08/23 1347 98 %     Weight 12/08/23 1345 175 lb (79.4 kg)     Height 12/08/23 1345 5\' 3"  (1.6 m)     Head Circumference --      Peak Flow --      Pain Score 12/08/23 1345 7     Pain Loc --      Pain Education --      Exclude from Growth Chart --     Most recent vital signs: Vitals:   12/08/23 1347  BP: (!) 149/90  Pulse: 92  Resp: 20  Temp: 98.2 F (36.8 C)  SpO2: 98%    General: Well appearing. Alert and oriented. INAD.  Skin:  Warm, dry and intact. No rashes or lesions noted.  Normal color appearance to bilateral lower extremity.   CV:  Good peripheral perfusion.  RESP:  Normal effort. BACK:  Spinous process is midline without deformity or tenderness. MSK:   Full ROM in all joints. No swelling or deformity.  Tenderness of the right hip over IT band and groin region with palpation.   Tenderness with leg abduction.  Negative Homans' sign.  No pitting edema.  No tenderness on calf squeeze.  NEURO: Cranial nerves intact. No focal deficits. Sensation and motor function intact. 5/5 muscle strength of UE & LE. Gait is steady.  OTHER:  Palpable pedis pulses and posterior tibialis pulses  bilaterally are equal and symmetric.       ED Results / Procedures / Treatments   Labs (all labs ordered are listed, but only abnormal results are displayed) Labs Reviewed - No data to display  RADIOLOGY  I personally viewed and evaluated these images as part of my medical decision making, as well as reviewing the written report by the radiologist.  ED Provider Interpretation: Ultrasound appears normal.  Will confirm with final radiology read.  US Venous Img Lower Unilateral Right Result Date: 12/08/2023 CLINICAL DATA:  Right lower extremity pain and swelling for several days. EXAM: RIGHT LOWER EXTREMITY VENOUS DOPPLER ULTRASOUND TECHNIQUE: Gray-scale sonography with compression, as well as color and duplex  ultrasound, were performed to evaluate the deep venous system(s) from the level of the common femoral vein through the popliteal and proximal calf veins. COMPARISON:  None Available. FINDINGS: VENOUS Normal compressibility of the common femoral, superficial femoral, and popliteal veins, as well as the visualized calf veins. Visualized portions of profunda femoral vein and great saphenous vein unremarkable. No filling defects to suggest DVT on grayscale or color Doppler imaging. Doppler waveforms show normal direction of venous flow, normal respiratory plasticity and response to augmentation. Limited views of the contralateral common femoral vein are unremarkable. OTHER None. Limitations: none IMPRESSION: Negative. Electronically Signed   By: Danae Orleans M.D.   On: 12/08/2023 15:41    PROCEDURES:  Critical Care performed: No  Procedures   MEDICATIONS ORDERED IN ED: Medications - No  data to display   IMPRESSION / MDM / ASSESSMENT AND PLAN / ED COURSE  I reviewed the triage vital signs and the nursing notes.                               58 y.o. female presents to the emergency department for evaluation and treatment of right leg pain. See HPI for further details.   Differential diagnosis includes, but is not limited to DVT, arterial occlusion, contusion, IT band syndrome, nerve entrapment.  Patient's presentation is most consistent with acute complicated illness / injury requiring diagnostic workup.  Patient is alert and oriented.  She is hemodynamically stable.  She is [facility] by [facility] to rule out a DVT.  On physical exam no indications of clinical diagnosis of a DVT.  Ultrasound obtained in triage and is negative for DVT.  This is reassuring.  There are palpable pulses bilaterally that are equal and symmetric in the lower extremity making my suspicion for arterial occlusion very low.  Contusion is less likely given no trauma or injury.  Given the distribution of the pain I do suspect a possible nerve entrapment or sciatica to be the source of the patient's symptoms.  I encouraged patient NSAID treatment outpatient and will prescribe a short course of steroids.  ED return precautions are discussed thoroughly.  Patient verbalized understanding.  At this time patient is in stable condition for discharge home.  She has to follow-up with her primary care for further evaluation.  All questions and concerns were addressed during this ED visit.   FINAL CLINICAL IMPRESSION(S) / ED DIAGNOSES   Final diagnoses:  Right leg pain    Rx / DC Orders   ED Discharge Orders          Ordered    predniSONE (DELTASONE) 50 MG tablet  Daily with breakfast        12/08/23 1623             Note:  This document was prepared using Dragon voice recognition software and may include unintentional dictation errors.    Romeo Apple, Marilla Boddy A, PA-C 12/08/23 1651    Merwyn Katos, MD 12/08/23 [phone]

## 2024-03-30 ENCOUNTER — Other Ambulatory Visit: Payer: Self-pay | Admitting: Podiatry

## 2024-03-30 ENCOUNTER — Encounter: Payer: Self-pay | Admitting: Podiatry

## 2024-03-30 ENCOUNTER — Ambulatory Visit (INDEPENDENT_AMBULATORY_CARE_PROVIDER_SITE_OTHER)

## 2024-03-30 ENCOUNTER — Ambulatory Visit: Admitting: Podiatry

## 2024-03-30 DIAGNOSIS — M722 Plantar fascial fibromatosis: Secondary | ICD-10-CM | POA: Diagnosis not present

## 2024-03-30 DIAGNOSIS — M7662 Achilles tendinitis, left leg: Secondary | ICD-10-CM | POA: Diagnosis not present

## 2024-03-30 MED ORDER — METHYLPREDNISOLONE 4 MG PO TBPK: ORAL_TABLET | ORAL | 0 refills | Status: DC

## 2024-03-30 MED ORDER — MELOXICAM 15 MG PO TABS: 15.0000 mg | ORAL_TABLET | Freq: Every day | ORAL | 3 refills | Status: AC

## 2024-03-30 NOTE — Progress Notes (Signed)
 Subjective:  Patient ID: Alexandra Stone, female    DOB: 1966/02/24,  MRN: [id number] HPI Chief Complaint  Patient presents with   Foot Pain    Posterior heel left - aching, shooting pains x 2 months, worse at end of day   New Patient (Initial Visit)    Est pt 03/2021    58 y.o. female presents with the above complaint.   ROS: Denies fever chills nausea vomit muscle aches pains calf pain back pain chest pain shortness of breath.  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   GERD (gastroesophageal reflux disease)    GERD (gastroesophageal reflux disease)    Heart murmur    followed by PCP   Hyperlipidemia    Hypertension    Pre-diabetes    Prediabetes    Past Surgical History:  Procedure Laterality Date   CERVICAL DISC SURGERY  05/2016   anterior fusion   CESAREAN SECTION     COLONOSCOPY     DILATION AND CURETTAGE OF UTERUS     ESOPHAGOGASTRODUODENOSCOPY N/A 12/28/2015   Procedure: ESOPHAGOGASTRODUODENOSCOPY (EGD);  Surgeon: Stephens Eis, MD;  Location: [facility];  Service: Gastroenterology;  Laterality: N/A;   TENOTOMY Right    arm    Current Outpatient Medications:    ibuprofen (ADVIL) 600 MG tablet, Take 600 mg by mouth 3 (three) times daily., Disp: , Rfl:    meloxicam (MOBIC) 15 MG tablet, Take 1 tablet (15 mg total) by mouth daily., Disp: 30 tablet, Rfl: 3   methylPREDNISolone (MEDROL DOSEPAK) 4 MG TBPK tablet, 6 day dose pack - take as directed, Disp: 21 tablet, Rfl: 0   ALLERGY RELIEF 180 MG tablet, Take 180 mg by mouth daily., Disp: , Rfl:    Cholecalciferol (VITAMIN D3) 1000 units CAPS, 2 (two) times daily., Disp: , Rfl:    EPINEPHrine 0.3 mg/0.3 mL IJ SOAJ injection, Inject into the muscle once as needed., Disp: , Rfl:    gentamicin ointment (GARAMYCIN) 0.1 %, Apply topically 3 (three) times daily., Disp: , Rfl:    hydrochlorothiazide (HYDRODIURIL) 25 MG  tablet, 25 mg., Disp: , Rfl:    losartan (COZAAR) 50 MG tablet, Take 50 mg by mouth daily., Disp: , Rfl:    metFORMIN (GLUCOPHAGE-XR) 500 MG 24 hr tablet, Take by mouth., Disp: , Rfl:    Multiple Vitamins-Minerals (SUPER THERA VITE M PO), Take by mouth daily., Disp: , Rfl:    omeprazole (PRILOSEC) 40 MG capsule, omeprazole 20 mg tablet,delayed release  Take by oral route., Disp: , Rfl:    rosuvastatin (CRESTOR) 20 MG tablet, Take 20 mg by mouth at bedtime., Disp: , Rfl:    Semaglutide 14 MG TABS, Take by mouth., Disp: , Rfl:    sucralfate (CARAFATE) 1 g tablet, Take by mouth., Disp: , Rfl:    triamcinolone (NASACORT ALLERGY 24HR) 55 MCG/ACT AERO nasal inhaler, , Disp: , Rfl:    TRUE METRIX BLOOD GLUCOSE TEST test strip, daily., Disp: , Rfl:   Allergies  Allergen Reactions   Other Rash    Bandaids   Ezetimibe Other (See Comments)    Muscle aches   Lisinopril Cough   Penicillins Other (See Comments)    Allergy found on testing, tolerating amoxicillin    Allergy found on testing, tolerating amoxicillin    Sulfa Antibiotics    Latex Itching and Rash   Review of Systems Objective:  There were no vitals filed for this visit.  General: Well developed, nourished, in no acute distress, alert and oriented x3   Dermatological: Skin is warm, dry and supple bilateral. Nails x 10 are well maintained; remaining integument appears unremarkable at this time. There are no open sores, no preulcerative lesions, no rash or signs of infection present.  Vascular: Dorsalis Pedis artery and Posterior Tibial artery pedal pulses are 2/4 bilateral with immedate capillary fill time. Pedal hair growth present. No varicosities and no lower extremity edema present bilateral.   Neruologic: Grossly intact via light touch bilateral. Vibratory intact via tuning fork bilateral. Protective threshold with Semmes Wienstein monofilament intact to all pedal sites bilateral. Patellar and Achilles deep tendon reflexes 2+  bilateral. No Babinski or clonus noted bilateral.   Musculoskeletal: No gross boney pedal deformities bilateral. No pain, crepitus, or limitation noted with foot and ankle range of motion bilateral. Muscular strength 5/5 in all groups tested bilateral.  Pain on palpation medial calcaneal tubercle of the left heel.  Gait: Unassisted, Nonantalgic.    Radiographs:  Radiographs taken today demonstrate an osseously mature individual normal osseous architecture soft tissue increase in density plantar fashion calcaneal insertion site.  No acute findings.  Assessment & Plan:   Assessment: Plantar fasciitis.  Plan: No injection was performed today.  Started her on methylprednisolone to be followed by meloxicam.  Discussed appropriate shoe gear and we placed her in a plantar fascial brace.  I will follow-up with her in 6 weeks.     Mills Mitton T. [place], [place]

## 2024-04-13 ENCOUNTER — Ambulatory Visit: Admitting: Podiatry

## 2024-05-04 ENCOUNTER — Telehealth: Payer: Self-pay | Admitting: Podiatry

## 2024-05-04 NOTE — Telephone Encounter (Signed)
 Patient stated that her statement shows a balance of $163.75, but also indicates that $93.75 is due. She is unsure whether her $70.00 copay was properly posted, as it does not appear on the statement.  Please review the account to confirm if the $70.00 copay was applied correctly and verify the correct outstanding balance. Give Patient a call for the update. Thank you

## 2024-05-11 ENCOUNTER — Ambulatory Visit: Admitting: Podiatry

## 2024-06-21 NOTE — Progress Notes (Unsigned)
 No chief complaint on file.    HPI:      [name] is a 58 y.o. [id number] who LMP was Patient's last menstrual period was 01/26/2018 (exact date)., presents today for her annual examination.  Her menses are absent due to menopause. Denies PMB. No vasomotor sx.    Sex activity: single partner, contraception - vasectomy. She does have vaginal dryness and uses lubricants/coconut with relief.    Last Pap: 05/15/22 Results were: no abnormalities /neg HPV DNA.  Hx of STDs: none  Last mammogram: 05/20/23 at [facility].  Results were: normal--routine follow-up in 12 months There is no FH of breast cancer. There is no FH of ovarian cancer. The patient does self-breast exams.   Colonoscopy: colonoscopy 7/20 with Dr. Gaylyn at [facility]. Repeat due after 5 yrs.   Tobacco use: The patient denies current or previous tobacco use. Alcohol use: none No drug use Exercise: moderately active   She does get adequate calcium and Vitamin D in her diet.   Labs with PCP.    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   GERD (gastroesophageal reflux disease)    GERD (gastroesophageal reflux disease)    Heart murmur    followed by PCP   Hyperlipidemia    Hypertension    Pre-diabetes    Prediabetes     Past Surgical History:  Procedure Laterality Date   CERVICAL DISC SURGERY  05/2016   anterior fusion   CESAREAN SECTION     COLONOSCOPY     DILATION AND CURETTAGE OF UTERUS     ESOPHAGOGASTRODUODENOSCOPY N/A 12/28/2015   Procedure: ESOPHAGOGASTRODUODENOSCOPY (EGD);  Surgeon: Deward CINDERELLA Piedmont, MD;  Location: [facility];  Service: Gastroenterology;  Laterality: N/A;   TENOTOMY Right    arm    No family history on file.  Social History   Socioeconomic History   Marital status: Married    Spouse name: Not on file   Number of children: 3   Years of education: Not on file   Highest education  level: Not on file  Occupational History   Not on file  Tobacco Use   Smoking status: Never   Smokeless tobacco: Never  Vaping Use   Vaping status: Never Used  Substance and Sexual Activity   Alcohol use: No   Drug use: No   Sexual activity: Yes    Birth control/protection: Post-menopausal  Other Topics Concern   Not on file  Social History Narrative   Not on file   Social Drivers of Health   Financial Resource Strain: Low Risk  (05/10/2024)   Received from [facility]   Overall Financial Resource Strain (CARDIA)    Difficulty of Paying Living Expenses: Not hard at all  Food Insecurity: No Food Insecurity (05/10/2024)   Received from [facility]   Hunger Vital Sign    Within the past 12 months, you worried that your food would run out before you got the money to buy more.: Never true    Within the past 12 months, the food you bought just didn't last and you didn't have money to get more.: Never true  Transportation Needs: No Transportation Needs (05/10/2024)   Received from [facility]    In the past 12 months, has lack of transportation kept you from  medical appointments or from getting medications?: No    Lack of Transportation (Non-Medical): No  Physical Activity: Not on file  Stress: Not on file  Social Connections: Not on file  Intimate Partner Violence: Not on file    Current Outpatient Medications on File Prior to Visit  Medication Sig Dispense Refill   ALLERGY RELIEF 180 MG tablet Take 180 mg by mouth daily.     Cholecalciferol (VITAMIN D3) 1000 units CAPS 2 (two) times daily.     EPINEPHrine 0.3 mg/0.3 mL IJ SOAJ injection Inject into the muscle once as needed.     gentamicin ointment (GARAMYCIN) 0.1 % Apply topically 3 (three) times daily.     hydrochlorothiazide (HYDRODIURIL) 25 MG tablet 25 mg.     ibuprofen (ADVIL) 600 MG tablet Take 600 mg by mouth 3 (three) times daily.      losartan (COZAAR) 50 MG tablet Take 50 mg by mouth daily.     meloxicam  (MOBIC ) 15 MG tablet Take 1 tablet (15 mg total) by mouth daily. 30 tablet 3   metFORMIN (GLUCOPHAGE-XR) 500 MG 24 hr tablet Take by mouth.     methylPREDNISolone  (MEDROL  DOSEPAK) 4 MG TBPK tablet 6 day dose pack - take as directed 21 tablet 0   Multiple Vitamins-Minerals (SUPER THERA VITE M PO) Take by mouth daily.     omeprazole (PRILOSEC) 40 MG capsule omeprazole 20 mg tablet,delayed release  Take by oral route.     rosuvastatin (CRESTOR) 20 MG tablet Take 20 mg by mouth at bedtime.     Semaglutide 14 MG TABS Take by mouth.     sucralfate (CARAFATE) 1 g tablet Take by mouth.     triamcinolone (NASACORT ALLERGY 24HR) 55 MCG/ACT AERO nasal inhaler      TRUE METRIX BLOOD GLUCOSE TEST test strip daily.     No current facility-administered medications on file prior to visit.    ROS:  Review of Systems  Constitutional:  Negative for fatigue, fever and unexpected weight change.  Respiratory:  Negative for cough, shortness of breath and wheezing.   Cardiovascular:  Negative for chest pain, palpitations and leg swelling.  Gastrointestinal:  Negative for blood in stool, constipation, diarrhea, nausea and vomiting.  Endocrine: Negative for cold intolerance, heat intolerance and polyuria.  Genitourinary:  Negative for dyspareunia, dysuria, flank pain, frequency, genital sores, hematuria, menstrual problem, pelvic pain, urgency, vaginal bleeding, vaginal discharge and vaginal pain.  Musculoskeletal:  Negative for back pain, joint swelling and myalgias.  Skin:  Negative for rash.  Neurological:  Negative for dizziness, syncope, light-headedness, numbness and headaches.  Hematological:  Negative for adenopathy.  Psychiatric/Behavioral:  Negative for agitation, confusion, sleep disturbance and suicidal ideas. The patient is not nervous/anxious.      Objective: LMP 01/26/2018 (Exact Date)    Physical Exam Constitutional:       Appearance: She is well-developed.  Genitourinary:     Vulva normal.     Right Labia: No rash, tenderness or lesions.    Left Labia: No tenderness, lesions or rash.    No vaginal discharge, erythema or tenderness.      Right Adnexa: not tender and no mass present.    Left Adnexa: not tender and no mass present.    No cervical motion tenderness, friability or polyp.     Uterus is not enlarged or tender.  Breasts:    Right: No mass, nipple discharge, skin change or tenderness.     Left: No mass, nipple discharge, skin change or  tenderness.  Neck:     Thyroid: No thyromegaly.  Cardiovascular:     Rate and Rhythm: Normal rate and regular rhythm.     Heart sounds: Normal heart sounds. No murmur heard. Pulmonary:     Effort: Pulmonary effort is normal.     Breath sounds: Normal breath sounds.  Abdominal:     Palpations: Abdomen is soft.     Tenderness: There is no abdominal tenderness. There is no guarding or rebound.  Musculoskeletal:        General: Normal range of motion.     Cervical back: Normal range of motion.  Lymphadenopathy:     Cervical: No cervical adenopathy.  Neurological:     General: No focal deficit present.     Mental Status: She is alert and oriented to person, place, and time.     Cranial Nerves: No cranial nerve deficit.  Skin:    General: Skin is warm and dry.  Psychiatric:        Mood and Affect: Mood normal.        Behavior: Behavior normal.        Thought Content: Thought content normal.        Judgment: Judgment normal.  Vitals reviewed.     Assessment/Plan: Encounter for annual routine gynecological examination  Encounter for screening mammogram for malignant neoplasm of breast; pt current on mammo  Vaginal dryness, menopausal--improved with coconut oil. F/u prn for vag ERT.   GYN counsel breast self exam, mammography screening, menopause, adequate intake of calcium and vitamin D, diet and exercise     F/U  No follow-ups on  file.  Eliyah Bazzi B. Ronna Herskowitz, PA-C 06/21/2024 8:54 AM

## 2024-06-22 ENCOUNTER — Ambulatory Visit: Admitting: Obstetrics and Gynecology

## 2024-06-22 ENCOUNTER — Encounter: Payer: Self-pay | Admitting: Obstetrics and Gynecology

## 2024-06-22 VITALS — BP 122/80 | HR 79 | Ht 63.0 in | Wt 174.0 lb

## 2024-06-22 DIAGNOSIS — Z01419 Encounter for gynecological examination (general) (routine) without abnormal findings: Secondary | ICD-10-CM

## 2024-06-22 DIAGNOSIS — N898 Other specified noninflammatory disorders of vagina: Secondary | ICD-10-CM

## 2024-06-22 DIAGNOSIS — Z1231 Encounter for screening mammogram for malignant neoplasm of breast: Secondary | ICD-10-CM

## 2024-06-22 DIAGNOSIS — Z01411 Encounter for gynecological examination (general) (routine) with abnormal findings: Secondary | ICD-10-CM

## 2024-06-22 DIAGNOSIS — Z1211 Encounter for screening for malignant neoplasm of colon: Secondary | ICD-10-CM

## 2024-06-22 LAB — HM MAMMOGRAPHY

## 2024-06-22 NOTE — Patient Instructions (Signed)
 I value your feedback and you entrusting [place] with your care. If you get a [place] patient survey, I would appreciate you taking the time to let us know about your experience today. Thank you! ? ? ?

## 2024-06-23 ENCOUNTER — Encounter: Payer: Self-pay | Admitting: Obstetrics and Gynecology

## 2024-06-29 ENCOUNTER — Encounter: Payer: Self-pay | Admitting: Obstetrics and Gynecology

## 2024-07-24 ENCOUNTER — Encounter: Payer: Self-pay | Admitting: Obstetrics and Gynecology

## 2024-07-24 DIAGNOSIS — N950 Postmenopausal bleeding: Secondary | ICD-10-CM

## 2024-07-28 ENCOUNTER — Ambulatory Visit (INDEPENDENT_AMBULATORY_CARE_PROVIDER_SITE_OTHER)

## 2024-07-28 ENCOUNTER — Ambulatory Visit
Admission: RE | Admit: 2024-07-28 | Discharge: 2024-07-28 | Disposition: A | Discharge status: Home / Self Care | Source: Ambulatory Visit | Attending: Obstetrics and Gynecology | Admitting: Obstetrics and Gynecology

## 2024-07-28 VITALS — BP 139/80 | HR 84 | Ht 63.0 in | Wt 174.0 lb

## 2024-07-28 DIAGNOSIS — N898 Other specified noninflammatory disorders of vagina: Secondary | ICD-10-CM

## 2024-07-28 DIAGNOSIS — M545 Low back pain, unspecified: Secondary | ICD-10-CM

## 2024-07-28 DIAGNOSIS — N950 Postmenopausal bleeding: Secondary | ICD-10-CM | POA: Diagnosis present

## 2024-07-28 LAB — POCT URINALYSIS DIPSTICK
Appearance: NEGATIVE
Bilirubin, UA: NEGATIVE
Blood, UA: NEGATIVE
Glucose, UA: NEGATIVE
Ketones, UA: NEGATIVE
Nitrite, UA: NEGATIVE
Protein, UA: NEGATIVE
Spec Grav, UA: 1.01 (ref 1.010–1.025)
Urobilinogen, UA: 0.2 U/dL
pH, UA: 6 (ref 5.0–8.0)

## 2024-07-28 NOTE — Progress Notes (Signed)
    NURSE VISIT NOTE  Subjective:    Patient ID: Alexandra Stone, female    DOB: 1966-07-04, 58 y.o.   MRN: [id number]       HPI  Patient is a 58 y.o. [phone] female who presents for vaginal discharge for 6 months.  Patient denies dysuria, hematuria, urinary frequency, urinary urgency, flank pain, abdominal pain, pelvic pain, cloudy malordorous urine, genital rash, and genital irritation.  Patient does not have a history of recurrent UTI.  Patient does not have a history of pyelonephritis.    Objective:    BP 139/80   Pulse 84   Ht 5' 3 (1.6 m)   Wt 174 lb (78.9 kg)   LMP 01/26/2018 (Exact Date)   BMI 30.82 kg/m    Lab Review  No results found for any visits on 07/28/24.  Assessment:   1. Vaginal discharge   2. Bilateral low back pain without sciatica, unspecified chronicity      Plan:   Urine Culture Sent. Pt states Alicia Copland PA wanted her to rule out a UTI.    Harlene Gander, CMA

## 2024-08-02 ENCOUNTER — Ambulatory Visit: Payer: Self-pay | Admitting: Obstetrics and Gynecology

## 2024-08-02 LAB — URINE CULTURE

## 2024-08-02 MED ORDER — CIPROFLOXACIN HCL 500 MG PO TABS: 500.0000 mg | ORAL_TABLET | Freq: Two times a day (BID) | ORAL | 0 refills | Status: AC

## 2024-08-02 NOTE — Addendum Note (Signed)
 Addended by: WATT HILA B on: 08/02/2024 07:19 PM   Modules accepted: Orders

## 2024-08-02 NOTE — Progress Notes (Signed)
 Pls call pt to schedule EMB with me this wk, same day appt spot ok. Thx.

## 2024-08-03 ENCOUNTER — Ambulatory Visit: Admitting: Obstetrics and Gynecology

## 2024-08-03 ENCOUNTER — Encounter: Payer: Self-pay | Admitting: Obstetrics and Gynecology

## 2024-08-03 VITALS — BP 124/69 | HR 73 | Ht 63.0 in | Wt 176.0 lb

## 2024-08-03 DIAGNOSIS — R9389 Abnormal findings on diagnostic imaging of other specified body structures: Secondary | ICD-10-CM | POA: Diagnosis not present

## 2024-08-03 DIAGNOSIS — N950 Postmenopausal bleeding: Secondary | ICD-10-CM

## 2024-08-03 DIAGNOSIS — N3000 Acute cystitis without hematuria: Secondary | ICD-10-CM

## 2024-08-03 NOTE — Patient Instructions (Signed)
 I value your feedback and you entrusting [place] with your care. If you get a [place] patient survey, I would appreciate you taking the time to let us know about your experience today. Thank you! ? ? ?

## 2024-08-03 NOTE — Telephone Encounter (Signed)
 Done on alternate msg.

## 2024-08-03 NOTE — Progress Notes (Signed)
 Alexandra Stone ORN, MD   Chief Complaint  Patient presents with   Endometrial Biopsy    HPI:      [name] is a 58 y.o. [id number] whose LMP was Patient's last menstrual period was 01/26/2018 (exact date)., presents today for EMB due to thickened EM on GYN u/s and PMB intermittently since 5/25. No sx on 9/25 exam; sx started again early Oct for a few days, usually a brownish/pink colored vag d/c with wiping only. No sx today. Had LBP with sx in Oct, no other UTI sx except frequency with good flow. UA in office was neg but C&S showed UTI (pseudomonas), started cipro today. No FH uterine/ovarian cancer. Not sexually active. Not on HRT.   GYN u/s 07/28/24: Endometrium thickness: 10.4 mm which is abnormally thickened in a postmenopausal bleeding female. No focal abnormality visualized.  Patient Active Problem List   Diagnosis Date Noted   Allergic reaction to alpha-gal 11/08/2023   Statin myopathy 10/11/2021   Controlled type 2 diabetes mellitus with complication, without long-term current use of insulin (HCC) 12/12/2020   Healthcare maintenance 12/12/2020   Atrophic vaginitis 12/27/2019   GERD (gastroesophageal reflux disease)    Hyperlipidemia    Hypertension    Prediabetes    Menopause 02/18/2018   Allergic rhinitis 10/13/2015   Cardiac murmur 06/01/2008    Past Surgical History:  Procedure Laterality Date   CERVICAL DISC SURGERY  05/2016   anterior fusion   CESAREAN SECTION     COLONOSCOPY     DILATION AND CURETTAGE OF UTERUS     ESOPHAGOGASTRODUODENOSCOPY N/A 12/28/2015   Procedure: ESOPHAGOGASTRODUODENOSCOPY (EGD);  Surgeon: Deward CINDERELLA Piedmont, MD;  Location: [facility];  Service: Gastroenterology;  Laterality: N/A;   TENOTOMY Right    arm    History reviewed. No pertinent family history.  Social History   Socioeconomic History   Marital status: Married    Spouse name: Not on file   Number of children: 3   Years of education: Not on file   Highest  education level: Not on file  Occupational History   Not on file  Tobacco Use   Smoking status: Never   Smokeless tobacco: Never  Vaping Use   Vaping status: Never Used  Substance and Sexual Activity   Alcohol use: No   Drug use: No   Sexual activity: Yes    Birth control/protection: Post-menopausal  Other Topics Concern   Not on file  Social History Narrative   Not on file   Social Drivers of Health   Financial Resource Strain: Low Risk  (05/10/2024)   Received from [facility]   Overall Financial Resource Strain (CARDIA)    Difficulty of Paying Living Expenses: Not hard at all  Food Insecurity: No Food Insecurity (05/10/2024)   Received from [facility]   Hunger Vital Sign    Within the past 12 months, you worried that your food would run out before you got the money to buy more.: Never true    Within the past 12 months, the food you bought just didn't last and you didn't have money to get more.: Never true  Transportation Needs: No Transportation Needs (05/10/2024)   Received from [facility] - Transportation    In the past 12 months, has lack of transportation kept you from medical appointments or from getting medications?: No    Lack of Transportation (Non-Medical): No  Physical Activity: Not  on file  Stress: Not on file  Social Connections: Not on file  Intimate Partner Violence: Not on file    Outpatient Medications Prior to Visit  Medication Sig Dispense Refill   ALLERGY RELIEF 180 MG tablet Take 180 mg by mouth daily.     Cholecalciferol (VITAMIN D3) 1000 units CAPS 2 (two) times daily.     ciprofloxacin (CIPRO) 500 MG tablet Take 1 tablet (500 mg total) by mouth 2 (two) times daily. 6 tablet 0   EPINEPHrine 0.3 mg/0.3 mL IJ SOAJ injection Inject into the muscle once as needed.     gentamicin ointment (GARAMYCIN) 0.1 % Apply topically 3 (three) times daily.     ibuprofen (ADVIL) 600 MG tablet Take 600  mg by mouth 3 (three) times daily.     losartan-hydrochlorothiazide (HYZAAR) 100-25 MG tablet Take 1 tablet by mouth daily.     meloxicam  (MOBIC ) 15 MG tablet Take 1 tablet (15 mg total) by mouth daily. 30 tablet 3   metFORMIN (GLUCOPHAGE-XR) 500 MG 24 hr tablet Take by mouth.     Multiple Vitamins-Minerals (SUPER THERA VITE M PO) Take by mouth daily.     omeprazole (PRILOSEC) 40 MG capsule omeprazole 20 mg tablet,delayed release  Take by oral route.     rosuvastatin (CRESTOR) 20 MG tablet Take 20 mg by mouth at bedtime.     Semaglutide 14 MG TABS Take by mouth.     sucralfate (CARAFATE) 1 g tablet Take by mouth.     triamcinolone (NASACORT ALLERGY 24HR) 55 MCG/ACT AERO nasal inhaler      TRUE METRIX BLOOD GLUCOSE TEST test strip daily.     methylPREDNISolone  (MEDROL  DOSEPAK) 4 MG TBPK tablet 6 day dose pack - take as directed 21 tablet 0   No facility-administered medications prior to visit.      ROS:  Review of Systems  Constitutional:  Negative for fever.  Gastrointestinal:  Negative for blood in stool, constipation, diarrhea, nausea and vomiting.  Genitourinary:  Positive for vaginal bleeding. Negative for dyspareunia, dysuria, flank pain, frequency, hematuria, urgency, vaginal discharge and vaginal pain.  Musculoskeletal:  Negative for back pain.  Skin:  Negative for rash.   BREAST: No symptoms   OBJECTIVE:   Vitals:  BP 124/69   Pulse 73   Ht 5' 3 (1.6 m)   Wt 176 lb (79.8 kg)   LMP 01/26/2018 (Exact Date)   BMI 31.18 kg/m   Physical Exam Vitals reviewed.  Constitutional:      Appearance: She is well-developed.  Pulmonary:     Effort: Pulmonary effort is normal.  Abdominal:     Tenderness: There is no right CVA tenderness or left CVA tenderness.  Genitourinary:    General: Normal vulva.     Pubic Area: No rash.      Labia:        Right: No rash, tenderness or lesion.        Left: No rash, tenderness or lesion.      Vagina: Normal. No vaginal discharge,  erythema or tenderness.     Cervix: Normal.  Musculoskeletal:        General: Normal range of motion.     Cervical back: Normal range of motion.  Skin:    General: Skin is warm and dry.  Neurological:     General: No focal deficit present.     Mental Status: She is alert and oriented to person, place, and time.  Psychiatric:  Mood and Affect: Mood normal.        Behavior: Behavior normal.        Thought Content: Thought content normal.        Judgment: Judgment normal.     Endometrial Biopsy After discussion with the patient regarding her abnormal uterine bleeding I recommended that she proceed with an endometrial biopsy for further diagnosis. The risks, benefits, alternatives, and indications for an endometrial biopsy were discussed with the patient in detail. She understood the risks including infection, bleeding, cervical laceration and uterine perforation.  Verbal consent was obtained.   PROCEDURE NOTE:  Pipelle endometrial biopsy was performed using aseptic technique with iodine preparation.  The uterus was sounded to a length of 7.0 cm.  Adequate sampling (minimal tissue) was obtained with minimal blood loss.  The patient tolerated the procedure well.  Disposition will be pending pathology.   Assessment/Plan: PMB (postmenopausal bleeding) - Plan: Pathology ([facility]); EMB today, with scant tissue. Will f/u with results; if WNL, question polyp.   Thickened endometrium - Plan: Pathology ([facility])  Acute cystitis without hematuria--started cipro today, no CVAT.     Return if symptoms worsen or fail to improve.  Theta Leaf B. Praise Stennett, PA-C 08/03/2024 2:58 PM

## 2024-08-07 LAB — ANATOMIC PATHOLOGY REPORT

## 2024-08-09 ENCOUNTER — Ambulatory Visit: Payer: Self-pay | Admitting: Obstetrics and Gynecology

## 2024-09-07 ENCOUNTER — Ambulatory Visit: Payer: Self-pay
